# Patient Record
Sex: Female | Born: 1960 | Hispanic: Yes | Marital: Married | Smoking: Never smoker
Health system: Southern US, Community
[De-identification: ages and names within clinical notes are randomized; demographics above are authoritative.]

## PROBLEM LIST (undated history)

## (undated) DIAGNOSIS — E119 Type 2 diabetes mellitus without complications: Secondary | ICD-10-CM

## (undated) HISTORY — PX: EYE SURGERY: SHX253

## (undated) HISTORY — DX: Type 2 diabetes mellitus without complications: E11.9

---

## 2014-11-25 ENCOUNTER — Ambulatory Visit (INDEPENDENT_AMBULATORY_CARE_PROVIDER_SITE_OTHER): Payer: Self-pay | Admitting: Family Medicine

## 2014-11-25 VITALS — BP 120/74 | HR 73 | Temp 98.6°F | Resp 18 | Ht 60.0 in | Wt 127.0 lb

## 2014-11-25 DIAGNOSIS — R51 Headache: Secondary | ICD-10-CM

## 2014-11-25 DIAGNOSIS — J029 Acute pharyngitis, unspecified: Secondary | ICD-10-CM

## 2014-11-25 DIAGNOSIS — Z789 Other specified health status: Secondary | ICD-10-CM

## 2014-11-25 DIAGNOSIS — E1165 Type 2 diabetes mellitus with hyperglycemia: Secondary | ICD-10-CM

## 2014-11-25 DIAGNOSIS — D72829 Elevated white blood cell count, unspecified: Secondary | ICD-10-CM

## 2014-11-25 DIAGNOSIS — IMO0002 Reserved for concepts with insufficient information to code with codable children: Secondary | ICD-10-CM

## 2014-11-25 DIAGNOSIS — R591 Generalized enlarged lymph nodes: Secondary | ICD-10-CM

## 2014-11-25 DIAGNOSIS — R519 Headache, unspecified: Secondary | ICD-10-CM

## 2014-11-25 DIAGNOSIS — R599 Enlarged lymph nodes, unspecified: Secondary | ICD-10-CM

## 2014-11-25 LAB — POCT CBC
Granulocyte percent: 78.9 %G (ref 37–80)
HCT, POC: 41.4 % (ref 37.7–47.9)
Hemoglobin: 13.7 g/dL (ref 12.2–16.2)
Lymph, poc: 2.6 (ref 0.6–3.4)
MCH, POC: 27.8 pg (ref 27–31.2)
MCHC: 33.1 g/dL (ref 31.8–35.4)
MCV: 83.9 fL (ref 80–97)
MID (cbc): 0.8 (ref 0–0.9)
MPV: 8.7 fL (ref 0–99.8)
POC Granulocyte: 12.7 — AB (ref 2–6.9)
POC LYMPH PERCENT: 15.9 %L (ref 10–50)
POC MID %: 5.2 %M (ref 0–12)
Platelet Count, POC: 237 10*3/uL (ref 142–424)
RBC: 4.93 M/uL (ref 4.04–5.48)
RDW, POC: 13.3 %
WBC: 16.1 10*3/uL — AB (ref 4.6–10.2)

## 2014-11-25 LAB — POCT RAPID STREP A (OFFICE): Rapid Strep A Screen: NEGATIVE

## 2014-11-25 LAB — GLUCOSE, POCT (MANUAL RESULT ENTRY): POC Glucose: 213 mg/dl — AB (ref 70–99)

## 2014-11-25 MED ORDER — HYDROCODONE-ACETAMINOPHEN 7.5-325 MG/15ML PO SOLN: 10.0000 mL | Freq: Four times a day (QID) | ORAL | Status: DC | PRN

## 2014-11-25 MED ORDER — AMOXICILLIN-POT CLAVULANATE 400-57 MG/5ML PO SUSR: ORAL | Status: DC

## 2014-11-25 MED ORDER — CEFTRIAXONE SODIUM 1 G IJ SOLR
1.0000 g | Freq: Once | INTRAMUSCULAR | Status: AC
  Administered 2014-11-25: 1 g via INTRAMUSCULAR

## 2014-11-25 NOTE — Progress Notes (Signed)
Subjective:    Patient ID: Brooke Dorsey, female    DOB: May 03, 1960, 54 y.o.   MRN: [id number]  HPI Brooke Dorsey is a 54 y.o. female  New patient to our office, here with acute complaints of headache and sore throat. She has a history of diabetes managed with metformin 850mg  BID and 75/25 humalog insulin at 8-10 units in am only. moved from [place] 3 months ago. Home blood sugar 200, 180 past few days. Doctor is in [place] - planning on returning there in 2 months.   Complains of sore throat and swelling on left side of neck past 3 days. Able to drink fluids, but trouble eating d/t pain. HA and face pain on left. No face weakness or numbness - only pain. No weakness, no n/v. No cat scratches or cats at home.   Sore to swallow, but she is clearing her secretions. Denies any dental pain or jaw pain.  Tx: none.   There are no active problems to display for this patient.  Past Medical History  Diagnosis Date  . Diabetes mellitus without complication    History reviewed. No pertinent past surgical history. No Known Allergies Prior to Admission medications   Medication Sig Start Date End Date Taking? Authorizing Provider  insulin lispro protamine-lispro (HUMALOG 75/25 MIX) (75-25) 100 UNIT/ML SUSP injection Inject into the skin.   Yes Historical Provider, MD  METFORMIN HCL PO Take by mouth.   Yes Historical Provider, MD   History   Social History  . Marital Status: Married    Spouse Name: N/A  . Number of Children: N/A  . Years of Education: N/A   Occupational History  . Not on file.   Social History Main Topics  . Smoking status: Never Smoker   . Smokeless tobacco: Not on file  . Alcohol Use: No  . Drug Use: No  . Sexual Activity: Not on file   Other Topics Concern  . Not on file   Social History Narrative  . No narrative on file       Review of Systems  Constitutional: Negative for fever and chills.  HENT: Positive for ear pain (l side only. ), sinus  pressure (face pain more than pressure. ), sore throat and trouble swallowing. Negative for congestion, dental problem, ear discharge, facial swelling, hearing loss, mouth sores and voice change.   Respiratory: Negative for cough, chest tightness and shortness of breath.   Cardiovascular: Negative for chest pain.  Musculoskeletal: Positive for neck pain (in front only. no posterior neck pain or stiffness. ).  Skin: Negative for color change, rash and wound.       Objective:   Physical Exam  Constitutional: She is oriented to person, place, and time. She appears well-developed and well-nourished. No distress.  HENT:  Head: Normocephalic and atraumatic.  Right Ear: Hearing, tympanic membrane, external ear and ear canal normal.  Left Ear: Hearing, tympanic membrane, external ear and ear canal normal.  Nose: Nose normal. Right sinus exhibits no maxillary sinus tenderness and no frontal sinus tenderness. Left sinus exhibits no maxillary sinus tenderness and no frontal sinus tenderness.  Mouth/Throat: Oropharynx is clear and moist and mucous membranes are normal. No oropharyngeal exudate, posterior oropharyngeal edema or posterior oropharyngeal erythema.  Diffusely tender over the left side of her face to frontal scalp, but primarily below the left ear and swollen lymph node left neck. No rash. No mastoid tenderness, no jaw tenderness, and no pain along the gumline.Dentures removed  -  no gum lesions. No dental tenderness. No rash. Does have some pain with opening her mouth fully, but reports this is in the back of her throat and the swollen lymph node in her neck that is causing the pain  In her throat I do not see any patches of erythema or white adherence. No apparent tonsillar hypertrophy or exudate. .  Eyes: Conjunctivae and EOM are normal. Pupils are equal, round, and reactive to light.  Neck: No thyromegaly present.  Cardiovascular: Normal rate, regular rhythm, normal heart sounds and intact  distal pulses.   No murmur heard. Pulmonary/Chest: Effort normal and breath sounds normal. No respiratory distress. She has no wheezes. She has no rhonchi.  Lymphadenopathy:    She has cervical adenopathy (Left AC node enlarged,  tender to palpation,).  Neurological: She is alert and oriented to person, place, and time.  Skin: Skin is warm and dry. No rash noted.  Psychiatric: She has a normal mood and affect. Her behavior is normal.  Vitals reviewed.  Filed Vitals:   11/25/14 1236  BP: 120/74  Pulse: 73  Temp: 98.6 F (37 C)  TempSrc: Oral  Resp: 18  Height: 5' (1.524 m)  Weight: 127 lb (57.607 kg)  SpO2: 98%    Results for orders placed or performed in visit on 11/25/14  POCT CBC  Result Value Ref Range   WBC 16.1 (A) 4.6 - 10.2 K/uL   Lymph, poc 2.6 0.6 - 3.4   POC LYMPH PERCENT 15.9 10 - 50 %L   MID (cbc) 0.8 0 - 0.9   POC MID % 5.2 0 - 12 %M   POC Granulocyte 12.7 (A) 2 - 6.9   Granulocyte percent 78.9 37 - 80 %G   RBC 4.93 4.04 - 5.48 M/uL   Hemoglobin 13.7 12.2 - 16.2 g/dL   HCT, POC 16.1 09.6 - 47.9 %   MCV 83.9 80 - 97 fL   MCH, POC 27.8 27 - 31.2 pg   MCHC 33.1 31.8 - 35.4 g/dL   RDW, POC 04.5 %   Platelet Count, POC 237 142 - 424 K/uL   MPV 8.7 0 - 99.8 fL  POCT glucose (manual entry)  Result Value Ref Range   POC Glucose 213 (A) 70 - 99 mg/dl  POCT rapid strep A  Result Value Ref Range   Rapid Strep A Screen Negative Negative       Assessment & Plan:   Brooke Dorsey is a 54 y.o. female Sore throat - Plan: POCT CBC, POCT rapid strep A, Culture, Group A Strep, HYDROcodone-acetaminophen (HYCET) 7.5-325 mg/15 ml solution  Lymphadenopathy of head and neck - Plan: POCT CBC, POCT rapid strep A, cefTRIAXone (ROCEPHIN) injection 1 g, amoxicillin-clavulanate (AUGMENTIN) 400-57 MG/5ML suspension  Nonintractable headache, unspecified chronicity pattern, unspecified headache type - Plan: POCT glucose (manual entry)  Diabetes type 2, uncontrolled - Plan:  POCT glucose (manual entry)  Language barrier  Leukocytosis - Plan: cefTRIAXone (ROCEPHIN) injection 1 g, amoxicillin-clavulanate (AUGMENTIN) 400-57 MG/5ML suspension  Left-sided neck lymphadenopathy, with headache for past 2-3 days. Also complains of pain with swallowing, but negative rapid strep. Leukocytosis without fever. No apparent oral source for infection, and jaw nontender. Slight discomfort with complete opening of mouth, concerning for possible pharyngitis with false-negative rapid strep versus early retropharyngeal abscess.   -Options discussed, but as afebrile and early symptoms, we decided on Rocephin 1 g IM 1, start Augmentin syrup, and hydrocodone suspension if needed for pain.  - Will follow-up tomorrow  morning with Dr. Katrinka Blazing for recheck, and if any worsening may need CT of neck to rule out significant abscess.  -Overnight ER/return to clinic precautions discussed.  -For diabetes, advised to check home blood sugars, and if remaining over 200, let us know or call primary provider in [place] to discuss medication changes.  -Spanish spoken with understanding expressed. All questions answered.     Meds ordered this encounter  Medications  . insulin lispro protamine-lispro (HUMALOG 75/25 MIX) (75-25) 100 UNIT/ML SUSP injection    Sig: Inject into the skin.  Marland Kitchen METFORMIN HCL PO    Sig: Take 850 mg by mouth 2 (two) times daily.  . cefTRIAXone (ROCEPHIN) injection 1 g    Sig:     Order Specific Question:  Antibiotic Indication:    Answer:  Bacteremia  . amoxicillin-clavulanate (AUGMENTIN) 400-57 MG/5ML suspension    Sig: 9ml po BID for 10 days.   Label in spanish    Dispense:  200 mL    Refill:  0  . HYDROcodone-acetaminophen (HYCET) 7.5-325 mg/15 ml solution    Sig: Take 10-15 mLs by mouth every 6 (six) hours as needed for moderate pain. Label in spanish    Dispense:  120 mL    Refill:  0   Patient Instructions  pastilla de antibiotica 2 veces cada dia, hydrocodone  liquido si necesario por dolor, regrese a la clinica por visita de Dr. Katrinka Blazing en la [place].  Si empeorse esta noche - va a hospital/cuarto de emergencia.   Si su Airline pilot mas de 200 - es necesario cambio su medicina de diabetes. Regresa hablar diabetes si remanecer alta.   Linfadenopata (Lymphadenopathy) Linfadenopata significa "enfermedad de los ganglios linfticos". Pero el trmino se [place] para describir la hinchazn o dilatacin de las glndulas linfticas, tambin llamadas ganglios linfticos. Son rganos con forma de frijol que se encuentran en muchos lugares, entre ellos el cuello, las axilas y la ingle. Las glndulas linfticas son parte del sistema inmunolgico, que lucha contra las infecciones en su cuerpo. La linfadenopata puede ocurrir slo en una zona de su cuerpo, como el cuello, o puede estar generalizada y Hydrographic surveyor un agrandamiento de los ganglios en varios lugares. Los ganglios del cuello son los que ms comnmente presentan el trastorno. CAUSAS  Cuando su sistema inmunolgico responde a los grmenes (como virus o bacterias), se producen clulas y lquidos para luchar contra la infeccin. Esto hace que las glndulas aumenten su tamao. Esto por lo general es normal y no debe preocuparse. En ocasiones, las mismas glndulas pueden infectarse e hincharse. Esto se denomina linfadenitis. El agrandamiento de los ganglios linfticos puede estar causado por [employer].  Enfermedades bacterianas, como faringitis estreptoccica o una infeccin de la piel.  Enfermedades virales, como el resfro comn.  Otros grmenes, como la enfermedad de Lyme, tuberculosis, o enfermedades de transmisin sexual.  Ciertos tipos de cncer, como linfoma (cncer del sistema linftico) o leucemia (cncer de los glbulos blancos).  Enfermedades inflamatorias como el lupus o la artritis reumatoide.  Reacciones alrgicas a medicamentos. Muchas de las enfermedades mencionadas arriba son poco  frecuentes, Scientist, clinical (histocompatibility and immunogenetics). Es por esto que debe ver al profesional que lo asiste si tiene linfadenopata. SNTOMAS  Bultos inflamados en el cuello, la parte posterior de la cabeza u otros lugares.  Sensibilidad.  Calor o enrojecimiento de la piel sobre los ganglios linfticos.  Grant Ruts. DIAGNSTICO A menudo el agrandamiento de los ganglios linfticos ocurre cerca del origen de la infeccin. Esto puede ayudar a los mdicos  a diagnosticar su enfermedad. Para ejemplo:   La presencia de ganglios linfticos inflamados cerca de la mandbula puede ser resultado de una infeccin en la boca.  Ganglios inflamados en el cuello son a menudo una seal de una infeccin en la garganta.  Si existen ganglios inflamados en ms de una zona es probable que exista una enfermedad causada por un virus. El profesional muy probablemente sabr qu es lo que causa su linfadenopata luego de Solicitor su historial y de examinarlo. Pueden ser necesarios anlisis de [place], radiografas y [place]. Si no se puede encontrar la causa de la inflamacin de los [place], y sta no se va por s misma, entonces puede ser necesaria una biopsia. El profesional lo comentar con usted. TRATAMIENTO El tratamiento depender de la causa que provoca la inflamacin. Muchas veces los ganglios volvern a su tamao normal, sin necesidad de Pharmacist, community. Es posible que deba utilizar antibiticos u otros medicamentos para tratar una infeccin. Slo tome medicamentos de venta libre o los que le prescriba su mdico para Engineer, materials, el malestar o la fiebre, segn las indicaciones. INSTRUCCIONES PARA EL CUIDADO DOMICILIARIO Las glndulas linfticas inflamadas volvern a su tamao normal cuando el trastorno subyacente que causa la inflamacin se haya curado. Si la inflamacin persiste, consulte con el profesional que lo asiste. Es posible que le prescriba antibiticos u otro tratamiento, esto depende del diagnstico. Utilice los  medicamentos tal como se le indic. Vaya a las citas de seguimiento que se hayan programado para controlar el estado de sus ganglios.  SOLICITE ATENCIN MDICA SI:  La inflamacin dura ms de [employer].  Tiene sntomas como prdida de [place], transpira por la noche, fatiga o fiebre persistente.  Los ganglios se sienten duros, parecen fijos en la piel o crecen con rapidez.  La piel sobre los ganglios linfticos se ve roja e inflamada. Esto puede ser indicio de una infeccin. SOLICITE ATENCIN MDICA DE INMEDIATO SI:  Comienza a filtrarse lquido de la zona del ganglio inflamado.  La temperatura se eleva por encima de 102 F (38.9 C).  Siente dolor fuerte (no necesariamente en la zona del ganglio inflamado).  Siente falta de aire o Journalist, newspaper.  Presenta dolor abdominal que empeora. ASEGRESE DE QUE:   Comprende estas instrucciones.  Controlar el trastorno.  Pedir ayuda enseguida si no se recupera adecuadamente o si empeora. Document Released: 07/11/2008 Document Revised: 07/07/2011 [facility] Patient Information 2015 [place], [place]. This information is not intended to replace advice given to you by your health care provider. Make sure you discuss any questions you have with your health care provider.

## 2014-11-25 NOTE — Patient Instructions (Addendum)
pastilla de antibiotica 2 veces cada dia, hydrocodone liquido si necesario por dolor, regrese a la clinica por visita de Dr. Katrinka Blazing en la [place].  Si empeorse esta noche - va a hospital/cuarto de emergencia.   Si su Airline pilot mas de 200 - es necesario cambio su medicina de diabetes. Regresa hablar diabetes si remanecer alta.   Linfadenopata (Lymphadenopathy) Linfadenopata significa "enfermedad de los ganglios linfticos". Pero el trmino se [place] para describir la hinchazn o dilatacin de las glndulas linfticas, tambin llamadas ganglios linfticos. Son rganos con forma de frijol que se encuentran en muchos lugares, entre ellos el cuello, las axilas y la ingle. Las glndulas linfticas son parte del sistema inmunolgico, que lucha contra las infecciones en su cuerpo. La linfadenopata puede ocurrir slo en una zona de su cuerpo, como el cuello, o puede estar generalizada y Hydrographic surveyor un agrandamiento de los ganglios en varios lugares. Los ganglios del cuello son los que ms comnmente presentan el trastorno. CAUSAS  Cuando su sistema inmunolgico responde a los grmenes (como virus o bacterias), se producen clulas y lquidos para luchar contra la infeccin. Esto hace que las glndulas aumenten su tamao. Esto por lo general es normal y no debe preocuparse. En ocasiones, las mismas glndulas pueden infectarse e hincharse. Esto se denomina linfadenitis. El agrandamiento de los ganglios linfticos puede estar causado por [employer].  Enfermedades bacterianas, como faringitis estreptoccica o una infeccin de la piel.  Enfermedades virales, como el resfro comn.  Otros grmenes, como la enfermedad de Lyme, tuberculosis, o enfermedades de transmisin sexual.  Ciertos tipos de cncer, como linfoma (cncer del sistema linftico) o leucemia (cncer de los glbulos blancos).  Enfermedades inflamatorias como el lupus o la artritis reumatoide.  Reacciones alrgicas a  medicamentos. Muchas de las enfermedades mencionadas arriba son poco frecuentes, Scientist, clinical (histocompatibility and immunogenetics). Es por esto que debe ver al profesional que lo asiste si tiene linfadenopata. SNTOMAS  Bultos inflamados en el cuello, la parte posterior de la cabeza u otros lugares.  Sensibilidad.  Calor o enrojecimiento de la piel sobre los ganglios linfticos.  Grant Ruts. DIAGNSTICO A menudo el agrandamiento de los ganglios linfticos ocurre cerca del origen de la infeccin. Esto puede ayudar a los mdicos a Publishing copy. Para ejemplo:   La presencia de ganglios linfticos inflamados cerca de la mandbula puede ser resultado de una infeccin en la boca.  Ganglios inflamados en el cuello son a menudo una seal de una infeccin en la garganta.  Si existen ganglios inflamados en ms de una zona es probable que exista una enfermedad causada por un virus. El profesional muy probablemente sabr qu es lo que causa su linfadenopata luego de Solicitor su historial y de examinarlo. Pueden ser necesarios anlisis de [place], radiografas y [place]. Si no se puede encontrar la causa de la inflamacin de los [place], y sta no se va por s misma, entonces puede ser necesaria una biopsia. El profesional lo comentar con usted. TRATAMIENTO El tratamiento depender de la causa que provoca la inflamacin. Muchas veces los ganglios volvern a su tamao normal, sin necesidad de Pharmacist, community. Es posible que deba utilizar antibiticos u otros medicamentos para tratar una infeccin. Slo tome medicamentos de venta libre o los que le prescriba su mdico para Engineer, materials, el malestar o la fiebre, segn las indicaciones. INSTRUCCIONES PARA EL CUIDADO DOMICILIARIO Las glndulas linfticas inflamadas volvern a su tamao normal cuando el trastorno subyacente que causa la inflamacin se haya curado. Si la inflamacin persiste, consulte con el  profesional que lo asiste. Es posible que le prescriba antibiticos  u otro tratamiento, esto depende del diagnstico. Utilice los medicamentos tal como se le indic. Vaya a las citas de seguimiento que se hayan programado para controlar el estado de sus ganglios.  SOLICITE ATENCIN MDICA SI:  La inflamacin dura ms de [employer].  Tiene sntomas como prdida de [place], transpira por la noche, fatiga o fiebre persistente.  Los ganglios se sienten duros, parecen fijos en la piel o crecen con rapidez.  La piel sobre los ganglios linfticos se ve roja e inflamada. Esto puede ser indicio de una infeccin. SOLICITE ATENCIN MDICA DE INMEDIATO SI:  Comienza a filtrarse lquido de la zona del ganglio inflamado.  La temperatura se eleva por encima de 102 F (38.9 C).  Siente dolor fuerte (no necesariamente en la zona del ganglio inflamado).  Siente falta de aire o Journalist, newspaper.  Presenta dolor abdominal que empeora. ASEGRESE DE QUE:   Comprende estas instrucciones.  Controlar el trastorno.  Pedir ayuda enseguida si no se recupera adecuadamente o si empeora. Document Released: 07/11/2008 Document Revised: 07/07/2011 [facility] Patient Information 2015 [place], [place]. This information is not intended to replace advice given to you by your health care provider. Make sure you discuss any questions you have with your health care provider.

## 2014-11-26 ENCOUNTER — Encounter (HOSPITAL_COMMUNITY): Payer: Self-pay | Admitting: *Deleted

## 2014-11-26 ENCOUNTER — Emergency Department (HOSPITAL_COMMUNITY)
Admission: EM | Admit: 2014-11-26 | Discharge: 2014-11-26 | Disposition: A | Discharge status: Home / Self Care | Payer: Self-pay | Attending: Emergency Medicine | Admitting: Emergency Medicine

## 2014-11-26 ENCOUNTER — Emergency Department (HOSPITAL_COMMUNITY): Payer: Self-pay

## 2014-11-26 DIAGNOSIS — Z79899 Other long term (current) drug therapy: Secondary | ICD-10-CM | POA: Insufficient documentation

## 2014-11-26 DIAGNOSIS — R739 Hyperglycemia, unspecified: Secondary | ICD-10-CM

## 2014-11-26 DIAGNOSIS — J029 Acute pharyngitis, unspecified: Secondary | ICD-10-CM | POA: Insufficient documentation

## 2014-11-26 DIAGNOSIS — E1165 Type 2 diabetes mellitus with hyperglycemia: Secondary | ICD-10-CM | POA: Insufficient documentation

## 2014-11-26 DIAGNOSIS — R112 Nausea with vomiting, unspecified: Secondary | ICD-10-CM | POA: Insufficient documentation

## 2014-11-26 LAB — I-STAT CHEM 8, ED
BUN: 7 mg/dL (ref 6–20)
Calcium, Ion: 1.16 mmol/L (ref 1.12–1.23)
Chloride: 97 mmol/L — ABNORMAL LOW (ref 101–111)
Creatinine, Ser: 0.6 mg/dL (ref 0.44–1.00)
Glucose, Bld: 321 mg/dL — ABNORMAL HIGH (ref 65–99)
HCT: 42 % (ref 36.0–46.0)
Hemoglobin: 14.3 g/dL (ref 12.0–15.0)
Potassium: 3.9 mmol/L (ref 3.5–5.1)
Sodium: 133 mmol/L — ABNORMAL LOW (ref 135–145)
TCO2: 22 mmol/L (ref 0–100)

## 2014-11-26 LAB — CULTURE, GROUP A STREP: Organism ID, Bacteria: NORMAL

## 2014-11-26 LAB — COMPREHENSIVE METABOLIC PANEL
ALT: 17 U/L (ref 14–54)
AST: 17 U/L (ref 15–41)
Albumin: 3.4 g/dL — ABNORMAL LOW (ref 3.5–5.0)
Alkaline Phosphatase: 102 U/L (ref 38–126)
Anion gap: 14 (ref 5–15)
BUN: 7 mg/dL (ref 6–20)
CO2: 24 mmol/L (ref 22–32)
Calcium: 9.1 mg/dL (ref 8.9–10.3)
Chloride: 95 mmol/L — ABNORMAL LOW (ref 101–111)
Creatinine, Ser: 0.73 mg/dL (ref 0.44–1.00)
GFR calc Af Amer: >60 mL/min (ref >60)
GFR calc non Af Amer: >60 mL/min (ref >60)
Glucose, Bld: 318 mg/dL — ABNORMAL HIGH (ref 65–99)
Potassium: 3.8 mmol/L (ref 3.5–5.1)
Sodium: 133 mmol/L — ABNORMAL LOW (ref 135–145)
Total Bilirubin: 1.4 mg/dL — ABNORMAL HIGH (ref 0.3–1.2)
Total Protein: 6.9 g/dL (ref 6.5–8.1)

## 2014-11-26 LAB — CBC WITH DIFFERENTIAL/PLATELET
Basophils Absolute: 0 10*3/uL (ref 0.0–0.1)
Basophils Relative: 0 % (ref 0–1)
Eosinophils Absolute: 0 10*3/uL (ref 0.0–0.7)
Eosinophils Relative: 0 % (ref 0–5)
HCT: 39.2 % (ref 36.0–46.0)
Hemoglobin: 13.4 g/dL (ref 12.0–15.0)
LYMPHS ABS: 1.8 10*3/uL (ref 0.7–4.0)
LYMPHS PCT: 11 % — AB (ref 12–46)
MCH: 28.5 pg (ref 26.0–34.0)
MCHC: 34.2 g/dL (ref 30.0–36.0)
MCV: 83.4 fL (ref 78.0–100.0)
MONO ABS: 1.1 10*3/uL — AB (ref 0.1–1.0)
MONOS PCT: 6 % (ref 3–12)
Neutro Abs: 13.5 10*3/uL — ABNORMAL HIGH (ref 1.7–7.7)
Neutrophils Relative %: 83 % — ABNORMAL HIGH (ref 43–77)
Platelets: 223 10*3/uL (ref 150–400)
RBC: 4.7 MIL/uL (ref 3.87–5.11)
RDW: 12.6 % (ref 11.5–15.5)
WBC: 16.4 10*3/uL — AB (ref 4.0–10.5)

## 2014-11-26 LAB — CBG MONITORING, ED
GLUCOSE-CAPILLARY: 269 mg/dL — AB (ref 65–99)
Glucose-Capillary: 237 mg/dL — ABNORMAL HIGH (ref 65–99)

## 2014-11-26 LAB — LIPASE, BLOOD: LIPASE: 11 U/L — AB (ref 22–51)

## 2014-11-26 MED ORDER — AMOXICILLIN-POT CLAVULANATE 875-125 MG PO TABS
1.0000 | ORAL_TABLET | Freq: Once | ORAL | Status: AC
  Administered 2014-11-26: 1 via ORAL
  Filled 2014-11-26: qty 1

## 2014-11-26 MED ORDER — ONDANSETRON HCL 4 MG/2ML IJ SOLN: 4.0000 mg | Freq: Once | INTRAMUSCULAR | Status: DC

## 2014-11-26 MED ORDER — MORPHINE SULFATE 4 MG/ML IJ SOLN
4.0000 mg | Freq: Once | INTRAMUSCULAR | Status: AC
  Administered 2014-11-26: 4 mg via INTRAVENOUS
  Filled 2014-11-26: qty 1

## 2014-11-26 MED ORDER — METFORMIN HCL 850 MG PO TABS
850.0000 mg | ORAL_TABLET | Freq: Two times a day (BID) | ORAL | Status: DC
  Administered 2014-11-26: 850 mg via ORAL
  Filled 2014-11-26 (×2): qty 1

## 2014-11-26 MED ORDER — IBUPROFEN 800 MG PO TABS
800.0000 mg | ORAL_TABLET | Freq: Once | ORAL | Status: AC
  Administered 2014-11-26: 800 mg via ORAL
  Filled 2014-11-26: qty 1

## 2014-11-26 MED ORDER — ONDANSETRON 4 MG PO TBDP: ORAL_TABLET | ORAL | Status: DC

## 2014-11-26 MED ORDER — SODIUM CHLORIDE 0.9 % IV SOLN
Freq: Once | INTRAVENOUS | Status: AC
  Administered 2014-11-26: 11:00:00 via INTRAVENOUS

## 2014-11-26 MED ORDER — ONDANSETRON HCL 4 MG/2ML IJ SOLN
4.0000 mg | Freq: Once | INTRAMUSCULAR | Status: AC
  Administered 2014-11-26: 4 mg via INTRAVENOUS
  Filled 2014-11-26: qty 2

## 2014-11-26 MED ORDER — IOHEXOL 300 MG/ML  SOLN
75.0000 mL | Freq: Once | INTRAMUSCULAR | Status: AC | PRN
  Administered 2014-11-26: 75 mL via INTRAVENOUS

## 2014-11-26 NOTE — ED Notes (Signed)
Patient transported to CT 

## 2014-11-26 NOTE — ED Notes (Signed)
Monitoring patient after receiving Morphine for 15 minutes.  Charge RN made aware.

## 2014-11-26 NOTE — Discharge Instructions (Signed)
You have a developing infection in your tonsils. It is very important to take the antibiotic your were prescribed. If you are unable to swallow, start drooling or have trouble breathing or talking return to the ER immediately. Follow up with the ENT specialist if your symptoms are not better by tomorrow.

## 2014-11-26 NOTE — ED Notes (Signed)
Pt started taking antibiotics for a sore throat yesterday.  She has been vomiting the meds up and not eating.  CBG in 200's, pt states did not take insulin today.  Per family, pt was given a shot yesterday.

## 2014-11-26 NOTE — ED Provider Notes (Signed)
CSN: [id number]     Arrival date & time 11/26/14  4098 History   None    Chief Complaint  Patient presents with  . Sore Throat  . Hyperglycemia     (Consider location/radiation/quality/duration/timing/severity/associated sxs/prior Treatment) HPI Comments: Brooke Dorsey, 54 y/o female with DM presents with a sore throat and hyperglycemia. History was limited by a language barrier, but her son was in the room and translated and provided the history. The sore throat began yesterday and she went to her PCP who gave her a 1 g Rocephin injection and prescribed her an augmentin suspension and hycet solution. Her pain is on the left side. She complains of swelling and pain with swallowing. She has had nothing to eat or drink since yesterday. This morning she vomited after taking her liquid medications. She is not currently nauseas. Patient also has a current blood glucose level of 321 and did not take her insulin this morning. Her normal is in the 120s. She has not taken her temperature at home and does not think she has had fevers. She denies night sweats or chills.   The history is provided by a relative and the patient. The history is limited by a language barrier.    Past Medical History  Diagnosis Date  . Diabetes mellitus without complication    Past Surgical History  Procedure Laterality Date  . Eye surgery      L eye removed   No family history on file. History  Substance Use Topics  . Smoking status: Never Smoker   . Smokeless tobacco: Not on file  . Alcohol Use: No   OB History    No data available     Review of Systems  HENT: Positive for sore throat and trouble swallowing.   Gastrointestinal: Positive for nausea and vomiting.  All other systems reviewed and are negative.     Allergies  Review of patient's allergies indicates no known allergies.  Home Medications   Prior to Admission medications   Medication Sig Start Date End Date Taking? Authorizing Provider   amoxicillin-clavulanate (AUGMENTIN) 400-57 MG/5ML suspension 9ml po BID for 10 days.   Label in spanish 11/25/14   Shade Flood, MD  HYDROcodone-acetaminophen (HYCET) 7.5-325 mg/15 ml solution Take 10-15 mLs by mouth every 6 (six) hours as needed for moderate pain. Label in spanish 11/25/14 11/25/15  Shade Flood, MD  insulin lispro protamine-lispro (HUMALOG 75/25 MIX) (75-25) 100 UNIT/ML SUSP injection Inject into the skin.    Historical Provider, MD  METFORMIN HCL PO Take 850 mg by mouth 2 (two) times daily.    Historical Provider, MD   BP 137/74 mmHg  Pulse 89  Temp(Src) 98.9 F (37.2 C) (Oral)  Resp 17  Ht 5\' 1"  (1.549 m)  Wt 127 lb (57.607 kg)  BMI 24.01 kg/m2  SpO2 99% Physical Exam  Constitutional: She is oriented to person, place, and time. She appears well-developed and well-nourished. No distress.  HENT:  Head: Normocephalic and atraumatic.  Mouth/Throat: Uvula is midline, oropharynx is clear and moist and mucous membranes are normal. No trismus in the jaw. No uvula swelling. No oropharyngeal exudate, posterior oropharyngeal edema, posterior oropharyngeal erythema or tonsillar abscesses.  Eyes: Conjunctivae are normal. No scleral icterus.  Neck: Normal range of motion.  Cardiovascular: Normal rate, regular rhythm, normal heart sounds and intact distal pulses.  Exam reveals no gallop and no friction rub.   No murmur heard. Pulmonary/Chest: Effort normal and breath sounds normal. No respiratory distress.  Abdominal: Soft. Bowel sounds are normal. She exhibits no distension and no mass. There is no tenderness. There is no guarding.  Lymphadenopathy:    She has cervical adenopathy.  Neurological: She is alert and oriented to person, place, and time.  Skin: Skin is warm and dry. She is not diaphoretic.    ED Course  Procedures (including critical care time) Labs Review Labs Reviewed  COMPREHENSIVE METABOLIC PANEL - Abnormal; Notable for the following:    Sodium 133  (*)    Chloride 95 (*)    Glucose, Bld 318 (*)    Albumin 3.4 (*)    Total Bilirubin 1.4 (*)    All other components within normal limits  LIPASE, BLOOD - Abnormal; Notable for the following:    Lipase 11 (*)    All other components within normal limits  CBC WITH DIFFERENTIAL/PLATELET - Abnormal; Notable for the following:    WBC 16.4 (*)    Neutrophils Relative % 83 (*)    Neutro Abs 13.5 (*)    Lymphocytes Relative 11 (*)    Monocytes Absolute 1.1 (*)    All other components within normal limits  CBG MONITORING, ED - Abnormal; Notable for the following:    Glucose-Capillary 269 (*)    All other components within normal limits  I-STAT CHEM 8, ED - Abnormal; Notable for the following:    Sodium 133 (*)    Chloride 97 (*)    Glucose, Bld 321 (*)    All other components within normal limits  CBG MONITORING, ED - Abnormal; Notable for the following:    Glucose-Capillary 237 (*)    All other components within normal limits    Imaging Review Ct Soft Tissue Neck W Contrast  11/26/2014   CLINICAL DATA:  Left neck swelling and pain and dysphagia for past 3 days.  EXAM: CT NECK WITH CONTRAST  TECHNIQUE: Multidetector CT imaging of the neck was performed using the standard protocol following the bolus administration of intravenous contrast.  CONTRAST:  75mL OMNIPAQUE IOHEXOL 300 MG/ML  SOLN  COMPARISON:  None.  FINDINGS: Asymmetric swelling and decreased attenuation is seen involving the left palatine tonsil, epiglottis, and retropharyngeal soft tissues. No focal fluid collection seen to suggest abscess formation.  Mild left level 2 jugular lymphadenopathy is seen with largest lymph node measuring up to 12 mm. No evidence of central necrosis. This is likely reactive in etiology. No other masses or lymphadenopathy identified within the neck.  IMPRESSION: Asymmetric edema involving the left palatine tonsil, epiglottis, and retropharyngeal soft tissues. No discrete abscess identified.  Mild left  level 2 jugular lymphadenopathy, likely reactive in etiology.   Electronically Signed   By: Myles Rosenthal M.D.   On: 11/26/2014 13:03     EKG Interpretation None      MDM   Final diagnoses:  Pharyngitis  Hyperglycemia    Patient able to tolerate by mouth fluids and her antibiotics. Pain medications given. CT scan shows lymphadenopathy without signs of deep space infections of the neck. This may still represent a developing abscess. However, there is nothing discrete at this point. Patient will be discharged. Follow up with ear, nose and throat tomorrow. Continues to take antibiotics and pain medication. Discussed return precautions. Patient appears safe for discharge at this time    Arthor Captain, PA-C 11/27/14 1856  Pricilla Loveless, MD 11/28/14 [phone]

## 2014-11-27 ENCOUNTER — Encounter (HOSPITAL_COMMUNITY): Payer: Self-pay | Admitting: Emergency Medicine

## 2014-11-27 ENCOUNTER — Inpatient Hospital Stay (HOSPITAL_COMMUNITY)
Admission: EM | Admit: 2014-11-27 | Discharge: 2014-12-03 | DRG: 152 | Disposition: A | Discharge status: Home / Self Care | Payer: Self-pay | Attending: Internal Medicine | Admitting: Internal Medicine

## 2014-11-27 DIAGNOSIS — H5702 Anisocoria: Secondary | ICD-10-CM

## 2014-11-27 DIAGNOSIS — R739 Hyperglycemia, unspecified: Secondary | ICD-10-CM

## 2014-11-27 DIAGNOSIS — J029 Acute pharyngitis, unspecified: Secondary | ICD-10-CM

## 2014-11-27 DIAGNOSIS — D72829 Elevated white blood cell count, unspecified: Secondary | ICD-10-CM

## 2014-11-27 DIAGNOSIS — L089 Local infection of the skin and subcutaneous tissue, unspecified: Secondary | ICD-10-CM

## 2014-11-27 DIAGNOSIS — R1310 Dysphagia, unspecified: Secondary | ICD-10-CM | POA: Diagnosis present

## 2014-11-27 DIAGNOSIS — D649 Anemia, unspecified: Secondary | ICD-10-CM | POA: Diagnosis present

## 2014-11-27 DIAGNOSIS — J392 Other diseases of pharynx: Secondary | ICD-10-CM

## 2014-11-27 DIAGNOSIS — J9600 Acute respiratory failure, unspecified whether with hypoxia or hypercapnia: Secondary | ICD-10-CM

## 2014-11-27 DIAGNOSIS — R591 Generalized enlarged lymph nodes: Secondary | ICD-10-CM

## 2014-11-27 DIAGNOSIS — J988 Other specified respiratory disorders: Secondary | ICD-10-CM | POA: Diagnosis present

## 2014-11-27 DIAGNOSIS — R221 Localized swelling, mass and lump, neck: Secondary | ICD-10-CM

## 2014-11-27 DIAGNOSIS — J390 Retropharyngeal and parapharyngeal abscess: Principal | ICD-10-CM | POA: Diagnosis present

## 2014-11-27 DIAGNOSIS — E1165 Type 2 diabetes mellitus with hyperglycemia: Secondary | ICD-10-CM | POA: Diagnosis present

## 2014-11-27 DIAGNOSIS — I9581 Postprocedural hypotension: Secondary | ICD-10-CM | POA: Diagnosis not present

## 2014-11-27 DIAGNOSIS — Z23 Encounter for immunization: Secondary | ICD-10-CM

## 2014-11-27 DIAGNOSIS — A419 Sepsis, unspecified organism: Secondary | ICD-10-CM | POA: Diagnosis present

## 2014-11-27 DIAGNOSIS — IMO0002 Reserved for concepts with insufficient information to code with codable children: Secondary | ICD-10-CM | POA: Insufficient documentation

## 2014-11-27 DIAGNOSIS — J9690 Respiratory failure, unspecified, unspecified whether with hypoxia or hypercapnia: Secondary | ICD-10-CM

## 2014-11-27 DIAGNOSIS — Z794 Long term (current) use of insulin: Secondary | ICD-10-CM

## 2014-11-27 DIAGNOSIS — J36 Peritonsillar abscess: Secondary | ICD-10-CM | POA: Diagnosis present

## 2014-11-27 DIAGNOSIS — E876 Hypokalemia: Secondary | ICD-10-CM | POA: Diagnosis present

## 2014-11-27 LAB — COMPREHENSIVE METABOLIC PANEL
ALBUMIN: 3.5 g/dL (ref 3.5–5.0)
ALT: 14 U/L (ref 14–54)
AST: 15 U/L (ref 15–41)
Alkaline Phosphatase: 102 U/L (ref 38–126)
Anion gap: 15 (ref 5–15)
BUN: 15 mg/dL (ref 6–20)
CALCIUM: 9.2 mg/dL (ref 8.9–10.3)
CO2: 16 mmol/L — ABNORMAL LOW (ref 22–32)
Chloride: 101 mmol/L (ref 101–111)
Creatinine, Ser: 0.97 mg/dL (ref 0.44–1.00)
GFR calc Af Amer: >60 mL/min (ref >60)
GFR calc non Af Amer: >60 mL/min (ref >60)
Glucose, Bld: 299 mg/dL — ABNORMAL HIGH (ref 65–99)
Potassium: 3.6 mmol/L (ref 3.5–5.1)
SODIUM: 132 mmol/L — AB (ref 135–145)
TOTAL PROTEIN: 7.6 g/dL (ref 6.5–8.1)
Total Bilirubin: 1.2 mg/dL (ref 0.3–1.2)

## 2014-11-27 LAB — CBC WITH DIFFERENTIAL/PLATELET
BASOS PCT: 0 % (ref 0–1)
Basophils Absolute: 0 10*3/uL (ref 0.0–0.1)
Eosinophils Absolute: 0 10*3/uL (ref 0.0–0.7)
Eosinophils Relative: 0 % (ref 0–5)
HCT: 37.7 % (ref 36.0–46.0)
Hemoglobin: 12.8 g/dL (ref 12.0–15.0)
Lymphocytes Relative: 9 % — ABNORMAL LOW (ref 12–46)
Lymphs Abs: 1.7 10*3/uL (ref 0.7–4.0)
MCH: 28.8 pg (ref 26.0–34.0)
MCHC: 34 g/dL (ref 30.0–36.0)
MCV: 84.9 fL (ref 78.0–100.0)
MONO ABS: 1.2 10*3/uL — AB (ref 0.1–1.0)
MONOS PCT: 6 % (ref 3–12)
Neutro Abs: 16 10*3/uL — ABNORMAL HIGH (ref 1.7–7.7)
Neutrophils Relative %: 85 % — ABNORMAL HIGH (ref 43–77)
Platelets: 267 10*3/uL (ref 150–400)
RBC: 4.44 MIL/uL (ref 3.87–5.11)
RDW: 12.9 % (ref 11.5–15.5)
WBC: 18.9 10*3/uL — ABNORMAL HIGH (ref 4.0–10.5)

## 2014-11-27 LAB — CBG MONITORING, ED: GLUCOSE-CAPILLARY: 274 mg/dL — AB (ref 65–99)

## 2014-11-27 LAB — LACTIC ACID, PLASMA: Lactic Acid, Venous: 1.1 mmol/L (ref 0.5–2.0)

## 2014-11-27 LAB — GLUCOSE, CAPILLARY: Glucose-Capillary: 198 mg/dL — ABNORMAL HIGH (ref 65–99)

## 2014-11-27 MED ORDER — INSULIN ASPART 100 UNIT/ML ~~LOC~~ SOLN: 0.0000 [IU] | Freq: Every day | SUBCUTANEOUS | Status: DC

## 2014-11-27 MED ORDER — PNEUMOCOCCAL VAC POLYVALENT 25 MCG/0.5ML IJ INJ
0.5000 mL | INJECTION | INTRAMUSCULAR | Status: DC
  Filled 2014-11-27: qty 0.5

## 2014-11-27 MED ORDER — ONDANSETRON HCL 4 MG/2ML IJ SOLN: 4.0000 mg | Freq: Four times a day (QID) | INTRAMUSCULAR | Status: DC | PRN

## 2014-11-27 MED ORDER — ONDANSETRON HCL 4 MG/2ML IJ SOLN
4.0000 mg | Freq: Once | INTRAMUSCULAR | Status: AC
  Administered 2014-11-27: 4 mg via INTRAVENOUS
  Filled 2014-11-27: qty 2

## 2014-11-27 MED ORDER — ENOXAPARIN SODIUM 40 MG/0.4ML ~~LOC~~ SOLN
40.0000 mg | SUBCUTANEOUS | Status: DC
  Administered 2014-11-28 – 2014-12-03 (×6): 40 mg via SUBCUTANEOUS
  Filled 2014-11-27 (×6): qty 0.4

## 2014-11-27 MED ORDER — ONDANSETRON HCL 4 MG PO TABS: 4.0000 mg | ORAL_TABLET | Freq: Four times a day (QID) | ORAL | Status: DC | PRN

## 2014-11-27 MED ORDER — SODIUM CHLORIDE 0.9 % IV BOLUS (SEPSIS): 1000.0000 mL | Freq: Once | INTRAVENOUS | Status: DC

## 2014-11-27 MED ORDER — INSULIN ASPART 100 UNIT/ML ~~LOC~~ SOLN
0.0000 [IU] | Freq: Three times a day (TID) | SUBCUTANEOUS | Status: DC
  Administered 2014-11-28 (×2): 2 [IU] via SUBCUTANEOUS

## 2014-11-27 MED ORDER — CLINDAMYCIN PHOSPHATE 600 MG/50ML IV SOLN
600.0000 mg | Freq: Three times a day (TID) | INTRAVENOUS | Status: DC
  Administered 2014-11-28 (×2): 600 mg via INTRAVENOUS
  Filled 2014-11-27 (×4): qty 50

## 2014-11-27 MED ORDER — CLINDAMYCIN PHOSPHATE 900 MG/50ML IV SOLN
900.0000 mg | Freq: Once | INTRAVENOUS | Status: AC
  Administered 2014-11-27: 900 mg via INTRAVENOUS
  Filled 2014-11-27: qty 50

## 2014-11-27 MED ORDER — SODIUM CHLORIDE 0.9 % IV SOLN
INTRAVENOUS | Status: AC
  Administered 2014-11-28: 06:00:00 via INTRAVENOUS

## 2014-11-27 MED ORDER — SODIUM CHLORIDE 0.9 % IV BOLUS (SEPSIS)
1000.0000 mL | Freq: Once | INTRAVENOUS | Status: AC
  Administered 2014-11-27: 1000 mL via INTRAVENOUS

## 2014-11-27 MED ORDER — MORPHINE SULFATE 2 MG/ML IJ SOLN
2.0000 mg | INTRAMUSCULAR | Status: DC | PRN
  Administered 2014-11-27 – 2014-11-28 (×4): 2 mg via INTRAVENOUS
  Filled 2014-11-27 (×4): qty 1

## 2014-11-27 MED ORDER — SODIUM CHLORIDE 0.9 % IV SOLN
INTRAVENOUS | Status: DC
  Administered 2014-11-27: 22:00:00 via INTRAVENOUS

## 2014-11-27 NOTE — ED Notes (Signed)
Patient states that she is taking antibiotics, but she is not able to keep them down. She is vomiting everytime she tries to take them.

## 2014-11-27 NOTE — ED Notes (Signed)
Pt. reports persistent left sided throat pain onset this week and elevated blood sugar ( 305) at home this evening with emesis , fatigue and poor appetite. Pt. seen by her PCP and here yesterday received antibiotics and currently taking prescription Clindamycin.

## 2014-11-27 NOTE — ED Provider Notes (Signed)
CSN: [id number]     Arrival date & time 11/27/14  1939 History   First MD Initiated Contact with Patient 11/27/14 2017     Chief Complaint  Patient presents with  . Sore Throat  . Hyperglycemia     (Consider location/radiation/quality/duration/timing/severity/associated sxs/prior Treatment) Patient is a 54 y.o. female presenting with pharyngitis and hyperglycemia. The history is provided by the patient and a relative. A language interpreter was used Estate manager/land agent are sons at bedside.).  Sore Throat Associated symptoms include chills, myalgias, nausea, a sore throat, vomiting and weakness. Pertinent negatives include no abdominal pain, chest pain, congestion, coughing or fever. Associated symptoms comments: Patient with a history of T2DM, insulin dependent, returns to the ED for worsening symptoms of painful swallowing, sore throat and left throat swelling. She also reports uncontrolled blood sugar. She has not been able to eat or drink at home secondary to painful swallowing but also because of significant nausea with morning vomiting. No diarrhea. No cough, congestion, diarrhea, dysuria..  Hyperglycemia Associated symptoms: nausea, vomiting and weakness   Associated symptoms: no abdominal pain, no chest pain, no fever and no shortness of breath     Past Medical History  Diagnosis Date  . Diabetes mellitus without complication    Past Surgical History  Procedure Laterality Date  . Eye surgery      L eye removed   No family history on file. History  Substance Use Topics  . Smoking status: Never Smoker   . Smokeless tobacco: Not on file  . Alcohol Use: No   OB History    No data available     Review of Systems  Constitutional: Positive for chills and appetite change. Negative for fever.  HENT: Positive for sore throat and trouble swallowing. Negative for congestion and ear pain.   Respiratory: Negative.  Negative for cough and shortness of breath.   Cardiovascular: Negative.   Negative for chest pain.  Gastrointestinal: Positive for nausea and vomiting. Negative for abdominal pain.  Genitourinary: Negative.   Musculoskeletal: Positive for myalgias and neck stiffness.       Left sided neck swelling/pain.  Skin: Negative.   Neurological: Positive for weakness.      Allergies  Review of patient's allergies indicates no known allergies.  Home Medications   Prior to Admission medications   Medication Sig Start Date End Date Taking? Authorizing Provider  amoxicillin-clavulanate (AUGMENTIN) 400-57 MG/5ML suspension 9ml po BID for 10 days.   Label in spanish 11/25/14   Shade Flood, MD  HYDROcodone-acetaminophen (HYCET) 7.5-325 mg/15 ml solution Take 10-15 mLs by mouth every 6 (six) hours as needed for moderate pain. Label in spanish 11/25/14 11/25/15  Shade Flood, MD  insulin lispro protamine-lispro (HUMALOG 75/25 MIX) (75-25) 100 UNIT/ML SUSP injection Inject 10 Units into the skin every morning.     Historical Provider, MD  metFORMIN (GLUCOPHAGE) 850 MG tablet Take 850 mg by mouth every evening.    Historical Provider, MD  ondansetron (ZOFRAN ODT) 4 MG disintegrating tablet  ODT q4 hours prn nausea/vomit 11/26/14   Pricilla Loveless, MD   BP 127/66 mmHg  Pulse 91  Temp(Src) 99.7 F (37.6 C) (Oral)  Resp 17  SpO2 98% Physical Exam  Constitutional: She is oriented to person, place, and time. She appears well-developed and well-nourished. No distress.  Nontoxic but uncomfortable appearing.  HENT:  Head: Normocephalic.  Right Ear: Tympanic membrane normal.  Left Ear: Tympanic membrane normal.  Mouth/Throat: Uvula is midline. Mucous membranes are dry.  No visualized peritonsillar abscess. No mastoid tenderness bilaterally. There is left neck fullness that is tender.   Neck: Normal range of motion. Neck supple.  Cardiovascular: Normal rate and regular rhythm.   No murmur heard. Pulmonary/Chest: Effort normal and breath sounds normal. She has no  wheezes. She has no rales.  Abdominal: Soft. Bowel sounds are normal. There is no tenderness. There is no rebound and no guarding.  Musculoskeletal: Normal range of motion.  Neurological: She is alert and oriented to person, place, and time.  Skin: Skin is warm and dry. No rash noted.  Psychiatric: She has a normal mood and affect.    ED Course  Procedures (including critical care time) Labs Review Labs Reviewed  CBC WITH DIFFERENTIAL/PLATELET - Abnormal; Notable for the following:    WBC 18.9 (*)    Neutrophils Relative % 85 (*)    Neutro Abs 16.0 (*)    Lymphocytes Relative 9 (*)    Monocytes Absolute 1.2 (*)    All other components within normal limits  COMPREHENSIVE METABOLIC PANEL - Abnormal; Notable for the following:    Sodium 132 (*)    CO2 16 (*)    Glucose, Bld 299 (*)    All other components within normal limits  CBG MONITORING, ED - Abnormal; Notable for the following:    Glucose-Capillary 274 (*)    All other components within normal limits    Imaging Review Ct Soft Tissue Neck W Contrast  11/26/2014   CLINICAL DATA:  Left neck swelling and pain and dysphagia for past 3 days.  EXAM: CT NECK WITH CONTRAST  TECHNIQUE: Multidetector CT imaging of the neck was performed using the standard protocol following the bolus administration of intravenous contrast.  CONTRAST:  75mL OMNIPAQUE IOHEXOL 300 MG/ML  SOLN  COMPARISON:  None.  FINDINGS: Asymmetric swelling and decreased attenuation is seen involving the left palatine tonsil, epiglottis, and retropharyngeal soft tissues. No focal fluid collection seen to suggest abscess formation.  Mild left level 2 jugular lymphadenopathy is seen with largest lymph node measuring up to 12 mm. No evidence of central necrosis. This is likely reactive in etiology. No other masses or lymphadenopathy identified within the neck.  IMPRESSION: Asymmetric edema involving the left palatine tonsil, epiglottis, and retropharyngeal soft tissues. No  discrete abscess identified.  Mild left level 2 jugular lymphadenopathy, likely reactive in etiology.   Electronically Signed   By: Myles Rosenthal M.D.   On: 11/26/2014 13:03     EKG Interpretation None      MDM   Final diagnoses:  None    1. Throat infection  Chart reviewed. CT performed yesterday is without discrete abscess. She has been at home unable to take antibiotics and so has failed outpatient treatment. Diabetic hyperglycemia in the setting of likely infection, increasing leukocytosis. Feel she needs admission for further treatment.   Patient is stable. IV antibiotics provided. She is in NAD and non-toxic in appearance. Discussed with Internal Medicine who accepts for admission.    Elpidio Anis, PA-C 11/28/14 0555  Laurence Spates, MD 12/01/14 [phone]

## 2014-11-27 NOTE — ED Notes (Signed)
PA at the bedside.

## 2014-11-28 ENCOUNTER — Inpatient Hospital Stay (HOSPITAL_COMMUNITY): Payer: Self-pay

## 2014-11-28 ENCOUNTER — Encounter (HOSPITAL_COMMUNITY)
Admission: EM | Disposition: A | Discharge status: Home / Self Care | Payer: Self-pay | Source: Home / Self Care | Attending: Pulmonary Disease

## 2014-11-28 ENCOUNTER — Inpatient Hospital Stay (HOSPITAL_COMMUNITY): Payer: Self-pay | Admitting: Anesthesiology

## 2014-11-28 ENCOUNTER — Encounter (HOSPITAL_COMMUNITY): Payer: Self-pay | Admitting: Radiology

## 2014-11-28 DIAGNOSIS — E1165 Type 2 diabetes mellitus with hyperglycemia: Secondary | ICD-10-CM | POA: Insufficient documentation

## 2014-11-28 DIAGNOSIS — J029 Acute pharyngitis, unspecified: Secondary | ICD-10-CM

## 2014-11-28 DIAGNOSIS — J390 Retropharyngeal and parapharyngeal abscess: Principal | ICD-10-CM

## 2014-11-28 DIAGNOSIS — IMO0002 Reserved for concepts with insufficient information to code with codable children: Secondary | ICD-10-CM | POA: Insufficient documentation

## 2014-11-28 DIAGNOSIS — A419 Sepsis, unspecified organism: Secondary | ICD-10-CM | POA: Diagnosis present

## 2014-11-28 HISTORY — PX: INTUBATION-ENDOTRACHEAL WITH TRACHEOSTOMY STANDBY: SHX6592

## 2014-11-28 HISTORY — PX: DIRECT LARYNGOSCOPY: SHX5326

## 2014-11-28 LAB — BASIC METABOLIC PANEL
Anion gap: 12 (ref 5–15)
BUN: 10 mg/dL (ref 6–20)
CALCIUM: 8.4 mg/dL — AB (ref 8.9–10.3)
CO2: 17 mmol/L — ABNORMAL LOW (ref 22–32)
Chloride: 107 mmol/L (ref 101–111)
Creatinine, Ser: 0.6 mg/dL (ref 0.44–1.00)
GFR calc Af Amer: >60 mL/min (ref >60)
GFR calc non Af Amer: >60 mL/min (ref >60)
GLUCOSE: 194 mg/dL — AB (ref 65–99)
Potassium: 3.2 mmol/L — ABNORMAL LOW (ref 3.5–5.1)
SODIUM: 136 mmol/L (ref 135–145)

## 2014-11-28 LAB — URINALYSIS, ROUTINE W REFLEX MICROSCOPIC
Glucose, UA: >1000 mg/dL — AB
Hgb urine dipstick: NEGATIVE
Ketones, ur: >80 mg/dL — AB
Leukocytes, UA: NEGATIVE
Nitrite: NEGATIVE
Protein, ur: 100 mg/dL — AB
SPECIFIC GRAVITY, URINE: 1.033 — AB (ref 1.005–1.030)
Urobilinogen, UA: 0.2 mg/dL (ref 0.0–1.0)
pH: 5.5 (ref 5.0–8.0)

## 2014-11-28 LAB — CBC
HEMATOCRIT: 34.8 % — AB (ref 36.0–46.0)
Hemoglobin: 11.6 g/dL — ABNORMAL LOW (ref 12.0–15.0)
MCH: 28.7 pg (ref 26.0–34.0)
MCHC: 33.3 g/dL (ref 30.0–36.0)
MCV: 86.1 fL (ref 78.0–100.0)
Platelets: 243 10*3/uL (ref 150–400)
RBC: 4.04 MIL/uL (ref 3.87–5.11)
RDW: 13.1 % (ref 11.5–15.5)
WBC: 15.7 10*3/uL — ABNORMAL HIGH (ref 4.0–10.5)

## 2014-11-28 LAB — BLOOD GAS, ARTERIAL
Acid-base deficit: 8.2 mmol/L — ABNORMAL HIGH (ref 0.0–2.0)
Bicarbonate: 16.6 mEq/L — ABNORMAL LOW (ref 20.0–24.0)
Drawn by: 418751
FIO2: 100
MECHVT: 380 mL
O2 Saturation: 99.7 %
PCO2 ART: 32.6 mmHg — AB (ref 35.0–45.0)
PEEP: 5 cmH2O
PO2 ART: 480 mmHg — AB (ref 80.0–100.0)
Patient temperature: 98.6
RATE: 16 resp/min
TCO2: 17.6 mmol/L (ref 0–100)
pH, Arterial: 7.327 — ABNORMAL LOW (ref 7.350–7.450)

## 2014-11-28 LAB — MRSA PCR SCREENING: MRSA by PCR: NEGATIVE

## 2014-11-28 LAB — GLUCOSE, CAPILLARY
Glucose-Capillary: 165 mg/dL — ABNORMAL HIGH (ref 65–99)
Glucose-Capillary: 171 mg/dL — ABNORMAL HIGH (ref 65–99)
Glucose-Capillary: 190 mg/dL — ABNORMAL HIGH (ref 65–99)

## 2014-11-28 LAB — PROCALCITONIN: Procalcitonin: <0.1 ng/mL

## 2014-11-28 LAB — URINE MICROSCOPIC-ADD ON

## 2014-11-28 SURGERY — INTUBATION-ENDOTRACHEAL WITH TRACHEOSTOMY STANDBY
Anesthesia: General | Site: Mouth

## 2014-11-28 MED ORDER — POTASSIUM CHLORIDE 10 MEQ/100ML IV SOLN: 10.0000 meq | INTRAVENOUS | Status: DC

## 2014-11-28 MED ORDER — MORPHINE SULFATE 2 MG/ML IJ SOLN
2.0000 mg | Freq: Once | INTRAMUSCULAR | Status: AC
  Administered 2014-11-28: 2 mg via INTRAVENOUS
  Filled 2014-11-28: qty 1

## 2014-11-28 MED ORDER — METHYLPREDNISOLONE SODIUM SUCC 40 MG IJ SOLR
40.0000 mg | Freq: Three times a day (TID) | INTRAMUSCULAR | Status: DC
  Administered 2014-11-28 – 2014-12-02 (×11): 40 mg via INTRAVENOUS
  Filled 2014-11-28 (×14): qty 1

## 2014-11-28 MED ORDER — POTASSIUM CHLORIDE 20 MEQ/15ML (10%) PO SOLN
40.0000 meq | Freq: Once | ORAL | Status: AC
Start: 2014-11-28 — End: 2014-11-28
  Administered 2014-11-28: 40 meq via ORAL
  Filled 2014-11-28: qty 30

## 2014-11-28 MED ORDER — LACTATED RINGERS IV SOLN
INTRAVENOUS | Status: DC | PRN
  Administered 2014-11-28 (×2): via INTRAVENOUS

## 2014-11-28 MED ORDER — MIDAZOLAM HCL 5 MG/5ML IJ SOLN
INTRAMUSCULAR | Status: DC | PRN
  Administered 2014-11-28: 2 mg via INTRAVENOUS

## 2014-11-28 MED ORDER — 0.9 % SODIUM CHLORIDE (POUR BTL) OPTIME
TOPICAL | Status: DC | PRN
  Administered 2014-11-28: 1000 mL

## 2014-11-28 MED ORDER — FENTANYL CITRATE (PF) 100 MCG/2ML IJ SOLN
25.0000 ug | INTRAMUSCULAR | Status: DC | PRN
  Administered 2014-11-29 – 2014-11-30 (×9): 50 ug via INTRAVENOUS
  Filled 2014-11-28 (×10): qty 2

## 2014-11-28 MED ORDER — DEXTROSE 5 % IV SOLN
0.0000 ug/min | INTRAVENOUS | Status: DC
  Filled 2014-11-28: qty 1

## 2014-11-28 MED ORDER — PANTOPRAZOLE SODIUM 40 MG IV SOLR
40.0000 mg | Freq: Every day | INTRAVENOUS | Status: DC
  Administered 2014-11-28 – 2014-12-01 (×4): 40 mg via INTRAVENOUS
  Filled 2014-11-28 (×5): qty 40

## 2014-11-28 MED ORDER — SODIUM CHLORIDE 0.9 % IV SOLN
1.5000 g | Freq: Four times a day (QID) | INTRAVENOUS | Status: DC
  Administered 2014-11-28 – 2014-12-03 (×19): 1.5 g via INTRAVENOUS
  Filled 2014-11-28 (×25): qty 1.5

## 2014-11-28 MED ORDER — IOHEXOL 300 MG/ML  SOLN
75.0000 mL | Freq: Once | INTRAMUSCULAR | Status: AC | PRN
  Administered 2014-11-28: 75 mL via INTRAVENOUS

## 2014-11-28 MED ORDER — PROPOFOL INFUSION 10 MG/ML OPTIME
INTRAVENOUS | Status: DC | PRN
  Administered 2014-11-28: 25 ug/kg/min via INTRAVENOUS

## 2014-11-28 MED ORDER — LIDOCAINE-EPINEPHRINE 1 %-1:100000 IJ SOLN
INTRAMUSCULAR | Status: AC
  Filled 2014-11-28: qty 1

## 2014-11-28 MED ORDER — ALBUTEROL SULFATE (2.5 MG/3ML) 0.083% IN NEBU
2.5000 mg | INHALATION_SOLUTION | RESPIRATORY_TRACT | Status: DC
  Administered 2014-11-28 – 2014-12-02 (×20): 2.5 mg via RESPIRATORY_TRACT
  Filled 2014-11-28 (×19): qty 3

## 2014-11-28 MED ORDER — LIDOCAINE HCL (CARDIAC) 20 MG/ML IV SOLN
INTRAVENOUS | Status: DC | PRN
  Administered 2014-11-28: 60 mg via INTRAVENOUS

## 2014-11-28 MED ORDER — PROPOFOL 10 MG/ML IV BOLUS
INTRAVENOUS | Status: DC | PRN
  Administered 2014-11-28: 40 mg via INTRAVENOUS
  Administered 2014-11-28 (×2): 20 mg via INTRAVENOUS

## 2014-11-28 MED ORDER — FENTANYL CITRATE (PF) 100 MCG/2ML IJ SOLN
INTRAMUSCULAR | Status: DC | PRN
  Administered 2014-11-28: 100 ug via INTRAVENOUS
  Administered 2014-11-28: 50 ug via INTRAVENOUS
  Administered 2014-11-28: 100 ug via INTRAVENOUS

## 2014-11-28 MED ORDER — SODIUM CHLORIDE 0.9 % IV SOLN
250.0000 mL | INTRAVENOUS | Status: DC | PRN
  Administered 2014-11-28: 250 mL via INTRAVENOUS

## 2014-11-28 MED ORDER — PROPOFOL 1000 MG/100ML IV EMUL
5.0000 ug/kg/min | INTRAVENOUS | Status: DC
  Administered 2014-11-28 – 2014-11-29 (×2): 25 ug/kg/min via INTRAVENOUS
  Administered 2014-11-29: 30 ug/kg/min via INTRAVENOUS
  Administered 2014-11-30: 49.652 ug/kg/min via INTRAVENOUS
  Administered 2014-11-30: 45 ug/kg/min via INTRAVENOUS
  Administered 2014-11-30: 35 ug/kg/min via INTRAVENOUS
  Administered 2014-12-01: 50 ug/kg/min via INTRAVENOUS
  Administered 2014-12-01: 45 ug/kg/min via INTRAVENOUS
  Filled 2014-11-28 (×11): qty 100

## 2014-11-28 MED ORDER — PHENYLEPHRINE HCL 10 MG/ML IJ SOLN
10.0000 mg | INTRAVENOUS | Status: DC | PRN
  Administered 2014-11-28: 10 ug/min via INTRAVENOUS

## 2014-11-28 SURGICAL SUPPLY — 11 items
BLADE SURG 15 STRL LF DISP TIS (BLADE) ×1 IMPLANT
BLADE SURG 15 STRL SS (BLADE) ×2
COVER MAYO STAND STRL (DRAPES) ×3 IMPLANT
COVER SURGICAL LIGHT HANDLE (MISCELLANEOUS) ×3 IMPLANT
ELECT COATED BLADE 2.86 ST (ELECTRODE) ×3 IMPLANT
GLOVE BIO SURGEON STRL SZ7.5 (GLOVE) ×3 IMPLANT
GLOVE BIOGEL M 7.0 STRL (GLOVE) ×3 IMPLANT
KIT BASIN OR (CUSTOM PROCEDURE TRAY) ×3 IMPLANT
PENCIL BUTTON HOLSTER BLD 10FT (ELECTRODE) ×3 IMPLANT
TRAY ENT MC OR (CUSTOM PROCEDURE TRAY) ×3 IMPLANT
YANKAUER SUCT BULB TIP NO VENT (SUCTIONS) ×3 IMPLANT

## 2014-11-28 NOTE — Consult Note (Signed)
ENT CONSULT:  Reason for Consult: Airway swelling and dysphagia Referring Physician: Luke is an 54 y.o. female.  HPI: The patient was admitted for increasing symptoms of sore throat and difficulty swallowing. She has a history of insulin-dependent diabetes and over the last 4 days has had increased difficulty drinking fluids. Start on oral antibiotics she was unable to tolerate her medication and was thus untreated. No prior history of dysphagia, swallowing difficulty or significant infection. The patient was admitted to the Teaching Service and initial CT scan showed significant swelling in the retropharyngeal region, patient started on intravenous antibiotics with an overall improvement in initial symptoms. This afternoon the patient noted increasing difficulty swallowing, no active respiratory issues or stridor, repeat CT scan showed increased swelling in the posterior pharynx with a significant fluid collection immediately superior to the larynx.  Past Medical History  Diagnosis Date  . Diabetes mellitus without complication     Past Surgical History  Procedure Laterality Date  . Eye surgery      L eye removed    History reviewed. No pertinent family history.  Social History:  reports that she has never smoked. She does not have any smokeless tobacco history on file. She reports that she does not drink alcohol or use illicit drugs.  Allergies: No Known Allergies  Medications: I have reviewed the patient's current medications.  Results for orders placed or performed during the hospital encounter of 11/27/14 (from the past 48 hour(s))  CBG monitoring, ED     Status: Abnormal   Collection Time: 11/27/14  7:48 PM  Result Value Ref Range   Glucose-Capillary 274 (H) 65 - 99 mg/dL  CBC with Differential     Status: Abnormal   Collection Time: 11/27/14  7:51 PM  Result Value Ref Range   WBC 18.9 (H) 4.0 - 10.5 K/uL   RBC 4.44 3.87 - 5.11 MIL/uL    Hemoglobin 12.8 12.0 - 15.0 g/dL   HCT 37.7 36.0 - 46.0 %   MCV 84.9 78.0 - 100.0 fL   MCH 28.8 26.0 - 34.0 pg   MCHC 34.0 30.0 - 36.0 g/dL   RDW 12.9 11.5 - 15.5 %   Platelets 267 150 - 400 K/uL   Neutrophils Relative % 85 (H) 43 - 77 %   Neutro Abs 16.0 (H) 1.7 - 7.7 K/uL   Lymphocytes Relative 9 (L) 12 - 46 %   Lymphs Abs 1.7 0.7 - 4.0 K/uL   Monocytes Relative 6 3 - 12 %   Monocytes Absolute 1.2 (H) 0.1 - 1.0 K/uL   Eosinophils Relative 0 0 - 5 %   Eosinophils Absolute 0.0 0.0 - 0.7 K/uL   Basophils Relative 0 0 - 1 %   Basophils Absolute 0.0 0.0 - 0.1 K/uL  Comprehensive metabolic panel     Status: Abnormal   Collection Time: 11/27/14  7:51 PM  Result Value Ref Range   Sodium 132 (L) 135 - 145 mmol/L   Potassium 3.6 3.5 - 5.1 mmol/L   Chloride 101 101 - 111 mmol/L   CO2 16 (L) 22 - 32 mmol/L   Glucose, Bld 299 (H) 65 - 99 mg/dL   BUN 15 6 - 20 mg/dL   Creatinine, Ser 0.97 0.44 - 1.00 mg/dL   Calcium 9.2 8.9 - 10.3 mg/dL   Total Protein 7.6 6.5 - 8.1 g/dL   Albumin 3.5 3.5 - 5.0 g/dL   AST 15 15 - 41 U/L   ALT  14 14 - 54 U/L   Alkaline Phosphatase 102 38 - 126 U/L   Total Bilirubin 1.2 0.3 - 1.2 mg/dL   GFR calc non Af Amer >60 >60 mL/min   GFR calc Af Amer >60 >60 mL/min    Comment: (NOTE) The eGFR has been calculated using the CKD EPI equation. This calculation has not been validated in all clinical situations. eGFR's persistently <60 mL/min signify possible Chronic Kidney Disease.    Anion gap 15 5 - 15  Lactic acid, plasma     Status: None   Collection Time: 11/27/14 10:10 PM  Result Value Ref Range   Lactic Acid, Venous 1.1 0.5 - 2.0 mmol/L  Glucose, capillary     Status: Abnormal   Collection Time: 11/27/14 11:00 PM  Result Value Ref Range   Glucose-Capillary 198 (H) 65 - 99 mg/dL  Urinalysis, Routine w reflex microscopic (not at [facility])     Status: Abnormal   Collection Time: 11/28/14  1:59 AM  Result Value Ref Range   Color, Urine YELLOW YELLOW    APPearance CLEAR CLEAR   Specific Gravity, Urine 1.033 (H) 1.005 - 1.030   pH 5.5 5.0 - 8.0   Glucose, UA >1000 (A) NEGATIVE mg/dL   Hgb urine dipstick NEGATIVE NEGATIVE   Bilirubin Urine SMALL (A) NEGATIVE   Ketones, ur >80 (A) NEGATIVE mg/dL   Protein, ur 100 (A) NEGATIVE mg/dL   Urobilinogen, UA 0.2 0.0 - 1.0 mg/dL   Nitrite NEGATIVE NEGATIVE   Leukocytes, UA NEGATIVE NEGATIVE  Urine microscopic-add on     Status: Abnormal   Collection Time: 11/28/14  1:59 AM  Result Value Ref Range   Squamous Epithelial / LPF FEW (A) RARE   WBC, UA 3-6 <3 WBC/hpf   RBC / HPF 0-2 <3 RBC/hpf   Bacteria, UA FEW (A) RARE   Casts HYALINE CASTS (A) NEGATIVE   Urine-Other FEW YEAST   Basic metabolic panel     Status: Abnormal   Collection Time: 11/28/14  5:55 AM  Result Value Ref Range   Sodium 136 135 - 145 mmol/L   Potassium 3.2 (L) 3.5 - 5.1 mmol/L   Chloride 107 101 - 111 mmol/L   CO2 17 (L) 22 - 32 mmol/L   Glucose, Bld 194 (H) 65 - 99 mg/dL   BUN 10 6 - 20 mg/dL   Creatinine, Ser 0.60 0.44 - 1.00 mg/dL   Calcium 8.4 (L) 8.9 - 10.3 mg/dL   GFR calc non Af Amer >60 >60 mL/min   GFR calc Af Amer >60 >60 mL/min    Comment: (NOTE) The eGFR has been calculated using the CKD EPI equation. This calculation has not been validated in all clinical situations. eGFR's persistently <60 mL/min signify possible Chronic Kidney Disease.    Anion gap 12 5 - 15  CBC     Status: Abnormal   Collection Time: 11/28/14  5:55 AM  Result Value Ref Range   WBC 15.7 (H) 4.0 - 10.5 K/uL   RBC 4.04 3.87 - 5.11 MIL/uL   Hemoglobin 11.6 (L) 12.0 - 15.0 g/dL   HCT 34.8 (L) 36.0 - 46.0 %   MCV 86.1 78.0 - 100.0 fL   MCH 28.7 26.0 - 34.0 pg   MCHC 33.3 30.0 - 36.0 g/dL   RDW 13.1 11.5 - 15.5 %   Platelets 243 150 - 400 K/uL  Glucose, capillary     Status: Abnormal   Collection Time: 11/28/14  6:59 AM  Result Value  Ref Range   Glucose-Capillary 190 (H) 65 - 99 mg/dL  Glucose, capillary     Status: Abnormal    Collection Time: 11/28/14 11:08 AM  Result Value Ref Range   Glucose-Capillary 165 (H) 65 - 99 mg/dL   Comment 1 Notify RN    Comment 2 Document in Chart     Ct Soft Tissue Neck W Contrast  11/28/2014   CLINICAL DATA:  Neck pain. Trouble swallowing. Patient feels like her throat is closing up.  EXAM: CT NECK WITH CONTRAST  TECHNIQUE: Multidetector CT imaging of the neck was performed using the standard protocol following the bolus administration of intravenous contrast.  CONTRAST:  53m OMNIPAQUE IOHEXOL 300 MG/ML  SOLN  COMPARISON:  11/26/2014.  FINDINGS: Findings are similar to the recent prior CT. There is heterogeneous, predominantly low attenuation edema/cellulitis that extends from the soft palate superiorly, surrounds the palatine tonsils, left greater than right, and contacts the submandibular glands, greater on the left. The inflammatory changes extend to the false cords and area for got folds and chest to the superior margin of the true vocal cords. There is a more defined retropharyngeal fluid collection, which measures 2 cm x 1.9 cm x 2.1 cm, which has mildly increased in size from the prior study and is better defined. It previously measured approximately 16 mm x 12 mm x 16 mm. The oral pharyngeal airway is narrowed anterior to this collection, similar to the prior exam. The upper laryngeal airway is also narrowed, similar to the prior study. Hazy inflammatory changes seen in the subcutaneous fat of the submental region.  Superior to the level of the soft palate, the parapharyngeal spaces are unremarkable. The masticator spaces are also unremarkable.  Salivary glands: There is heterogeneous attenuation of the left submandibular gland with more homogeneous attenuation of the right submandibular gland. Left submandibular gland is mildly larger than the right. This may be reactive to the adjacent inflammation. It could potentially be origin, felt less likely. The parotid glands are unremarkable.   Thyroid: Small ill-defined hypo attenuating lesions are noted consistent with sub cm nodules, stable.  Lymph nodes: Mildly enlarged left sided lymph nodes are noted. There is an 11 mm short axis level 2 jugulodigastric node. A slightly larger node is seen just below this on the left measuring just over 12 mm in short axis. These are stable. No new adenopathy.  Vascular: Unremarkable.  Limited intracranial: Unremarkable.  Visualized orbits: Unremarkable.  Mastoids and visualized paranasal sinuses: Clear.  Skeleton: No significant abnormality. No bone resorption is seen to suggest osteomyelitis.  Upper chest: No upper mediastinal mass or enlarged lymph node. Visualized upper lungs are clear.  IMPRESSION: 1. Slight worsening when compared to the prior CT. 2. There are persistent, extensive inflammatory changes surrounding and involving the oral pharynx and larynx, left greater than right. Inflammatory changes are contiguous with the left submandibular gland, which is also heterogeneous in attenuation and slightly larger than the right submandibular gland. 3. There is a retropharyngeal collection consistent with an abscess, which is better defined and larger than it was previously. Currently it measures 2 x 1.9 x 2.1 cm. No other discrete collection. 4. Oral pharyngeal and upper laryngeal airway is narrowed similar to the prior study.   Electronically Signed   By: DLajean ManesM.D.   On: 11/28/2014 16:10    ROS:ROS 12 systems reviewed and negative except as stated in HPI   Blood pressure 131/60, pulse 73, temperature 98.2 F (36.8 C), temperature source  Oral, resp. rate 15, height _0  (1.549 m), weight 57.335 kg (126 lb 6.4 oz), SpO2 98 %.  PHYSICAL EXAM: General appearance - alert, well appearing, and in no distress, no stridor Nose - normal and patent, no erythema, discharge or polyps Mouth - mucous membranes moist, pharynx normal without lesions Neck - supple, no significant adenopathy  Flexible  Laryngoscopy: 4 mm flexible laryngoscope inserted without difficulty to the left nostril, patent nasal passageway and nasopharynx. Moderate purulent secretions in the pharynx with significant supraglottic edema, erythema and swelling area no stridor and vocal cords normal. Post glottic swelling particular on the left consistent with possible retropharyngeal versus arytenoid fluid collection.  Studies Reviewed: Above CT scan  Assessment/Plan: ENT service consult and for progressive symptoms of dysphagia and CT findings of retropharyngeal swelling. Flexible laryngoscopy shows significant soft tissue swelling which may be consistent with retropharyngeal fluid collection versus possible infected arytenoid cyst. Recommend emergency intubation for airway management with possible tracheostomy. Plan direct laryngoscopy examination of the airway and possible incision and drainage of fluid collection. Risk and benefits of these procedures were discussed in detail with the patient's family, her son served as interpreter speaking excellent English, they understood and agreed with our plan.     [place], Tonjia Parillo 11/28/2014, 5:36 PM

## 2014-11-28 NOTE — Anesthesia Procedure Notes (Signed)
Procedure Name: Intubation Date/Time: 11/28/2014 5:55 PM Performed by: Coralee Rud Pre-anesthesia Checklist: Patient identified, Emergency Drugs available, Suction available and Patient being monitored Patient Re-evaluated:Patient Re-evaluated prior to inductionOxygen Delivery Method: Circle system utilized Preoxygenation: Pre-oxygenation with 100% oxygen Intubation Type: IV induction and Inhalational induction Ventilation: Mask ventilation without difficulty Laryngoscope Size: Glidescope Grade View: Grade II Tube type: Oral Tube size: 7.0 mm Number of attempts: 1 Airway Equipment and Method: Video-laryngoscopy Placement Confirmation: ETT inserted through vocal cords under direct vision,  positive ETCO2,  CO2 detector and breath sounds checked- equal and bilateral Secured at: 22 cm Comments: O2 @ 3l/m nasal cannula.  Cetacaine spray to tongue and pharynx.  Patient in no distress.  Glide scope visualization, Grade 2 view.  Bougie passed int trachea and #7 ETT passed easily into trachea.  BBS, chest and CO2.

## 2014-11-28 NOTE — Progress Notes (Signed)
MD at bedside, consent obtained for emergent intubation. Pt family aware and at bedside, questions answered and education given. Will continue to monitor.

## 2014-11-28 NOTE — Consult Note (Addendum)
PULMONARY / CRITICAL CARE MEDICINE   Name: Brooke Dorsey MRN: [id number] DOB: 07-05-1960    ADMISSION DATE:  11/27/2014 CONSULTATION DATE:  11/28/14  REFERRING MD :  Dr. Annalee Genta  CHIEF COMPLAINT:  Recurrent oropharyngeal/tonsillar cellulitis  INITIAL PRESENTATION: 54 year old female with known history of diabetes presented with a four-day history of worsening left neck pain & sore throat. Patient given IM Rocephin on July 30 but per records unable to tolerate oral antibiotics.  STUDIES:  CT Neck/Soft Tissue (11/28/14): Slight worsening with extensive inflammatory changes surrounding the oropharynx and larynx left greater than right. Inflammatory changes contiguous left submandibular gland. Suggestion of retropharyngeal collection consistent with abscess.  SIGNIFICANT EVENTS: 8/2 - OR exam & intubation w/ tonsilar biopsy  HISTORY OF PRESENT ILLNESS:  Patient currently intubated postoperatively and sedated. No family at bedside to obtain history from. History obtained from electronic medical record. Patient had a four-day history of sore throat with a negative rapid strep screen on July 30. She was unable to tolerate oral antibiotics and had no stridor or dyspnea on presentation. Patient began to have difficulty managing her oral secretions by the afternoon and a repeat CT scan was performed of her soft tissues. There was concern for a retropharyngeal abscess and patient was electively intubated and taken to the operating room by Dr. Annalee Genta. No such abscess was found. Tonsillar biopsy was performed. The patient was transitioned to the intensive care unit for further management and continued endotracheal agitation daily protection. Admission the patient's antibiotics were switched to clindamycin but the patient did not truly failed outpatient therapy with Augmentin.  PAST MEDICAL HISTORY :   has a past medical history of Diabetes mellitus without complication.  has past surgical history  that includes Eye surgery. Prior to Admission medications   Medication Sig Start Date End Date Taking? Authorizing Provider  amoxicillin-clavulanate (AUGMENTIN) 400-57 MG/5ML suspension 9ml po BID for 10 days.   Label in spanish Patient taking differently: See admin instructions. 9ml po BID for 10 days.   Label in spanish 11/25/14  Yes Shade Flood, MD  clindamycin (CLEOCIN) 300 MG capsule Take 300 mg by mouth 4 (four) times daily. Take for 10 days started on 11-27-14   Yes Historical Provider, MD  HYDROcodone-acetaminophen (HYCET) 7.5-325 mg/15 ml solution Take 10-15 mLs by mouth every 6 (six) hours as needed for moderate pain. Label in spanish Patient taking differently: Take 10-45 mLs by mouth every 6 (six) hours as needed for moderate pain. Label in spanish 11/25/14 11/25/15 Yes Shade Flood, MD  insulin lispro protamine-lispro (HUMALOG 75/25 MIX) (75-25) 100 UNIT/ML SUSP injection Inject 10 Units into the skin every morning.    Yes Historical Provider, MD  ondansetron (ZOFRAN-ODT) 4 MG disintegrating tablet Take 4 mg by mouth every 4 (four) hours as needed for nausea or vomiting.   Yes Historical Provider, MD  ondansetron (ZOFRAN ODT) 4 MG disintegrating tablet 4mg  ODT q4 hours prn nausea/vomit Patient not taking: Reported on 11/27/2014 11/26/14   Pricilla Loveless, MD   No Known Allergies  FAMILY HISTORY:  has no family status information on file.  SOCIAL HISTORY:  reports that she has never smoked. She does not have any smokeless tobacco history on file. She reports that she does not drink alcohol or use illicit drugs.  REVIEW OF SYSTEMS:  Unobtainable as the patient is currently intubated and sedated.  SUBJECTIVE:   VITAL SIGNS: Temp:  [98.1 F (36.7 C)-99.9 F (37.7 C)] 98.7 F (37.1 C) (08/02 1852) Pulse  Rate:  [71-99] 89 (08/02 1852) Resp:  [15-20] 20 (08/02 1852) BP: (113-141)/(51-74) 141/70 mmHg (08/02 1852) SpO2:  [96 %-100 %] 100 % (08/02 1852) FiO2 (%):  [100 %] 100  % (08/02 1850) Weight:  [126 lb 6.4 oz (57.335 kg)] 126 lb 6.4 oz (57.335 kg) (08/01 2257) HEMODYNAMICS:   VENTILATOR SETTINGS: Vent Mode:  [-] PRVC FiO2 (%):  [100 %] 100 % Set Rate:  [16 bmp] 16 bmp Vt Set:  [380 mL] 380 mL PEEP:  [5 cmH20] 5 cmH20 Plateau Pressure:  [14 cmH20] 14 cmH20 INTAKE / OUTPUT:  Intake/Output Summary (Last 24 hours) at 11/28/14 1923 Last data filed at 11/28/14 1845  Gross per 24 hour  Intake   1000 ml  Output      0 ml  Net   1000 ml    PHYSICAL EXAMINATION: General:  Sedated. No acute distress. Just arrived from operating room.  Integument:  Warm & dry. No rash on exposed skin. No bruising. Lymphatics:  No appreciated cervical or supraclavicular lymphadenoapthy. 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 Endotracheal tube in place. No scleral injection or icterus. Cardiovascular:  Regular rate. No edema. No appreciable JVD.  Pulmonary:  Good aeration & clear to auscultation bilaterally. Symmetric chest wall rise. No accessory muscle use on ventilator . Abdomen: Soft. Normal bowel sounds. Nondistended.  Musculoskeletal:  Normal bulk. No joint deformity or effusion appreciated. Neurological:  Moving all 4 extremities spontaneously with painful stimuli. Left eye enucleated. Sedated. Psychiatric:  Unable to assess as patient is currently intubated and sedated with propofol and sedated.  LABS:  CBC  Recent Labs Lab 11/26/14 0905 11/26/14 0916 11/27/14 1951 11/28/14 0555  WBC 16.4*  --  18.9* 15.7*  HGB 13.4 14.3 12.8 11.6*  HCT 39.2 42.0 37.7 34.8*  PLT 223  --  267 243   Coag's No results for input(s): APTT, INR in the last 168 hours. BMET  Recent Labs Lab 11/26/14 0905 11/26/14 0916 11/27/14 1951 11/28/14 0555  NA 133* 133* 132* 136  K 3.8 3.9 3.6 3.2*  CL 95* 97* 101 107  CO2 24  --  16* 17*  BUN 7 7 15 10   CREATININE 0.73 0.60 0.97 0.60  GLUCOSE 318* 321* 299* 194*   Electrolytes  Recent Labs Lab  11/26/14 0905 11/27/14 1951 11/28/14 0555  CALCIUM 9.1 9.2 8.4*   Sepsis Markers  Recent Labs Lab 11/27/14 2210  LATICACIDVEN 1.1   ABG No results for input(s): PHART, PCO2ART, PO2ART in the last 168 hours. Liver Enzymes  Recent Labs Lab 11/26/14 0905 11/27/14 1951  AST 17 15  ALT 17 14  ALKPHOS 102 102  BILITOT 1.4* 1.2  ALBUMIN 3.4* 3.5   Cardiac Enzymes No results for input(s): TROPONINI, PROBNP in the last 168 hours. Glucose  Recent Labs Lab 11/26/14 0830 11/26/14 1158 11/27/14 1948 11/27/14 2300 11/28/14 0659 11/28/14 1108  GLUCAP 269* 237* 274* 198* 190* 165*    Imaging Ct Soft Tissue Neck W Contrast  11/28/2014   CLINICAL DATA:  Neck pain. Trouble swallowing. Patient feels like her throat is closing up.  EXAM: CT NECK WITH CONTRAST  TECHNIQUE: Multidetector CT imaging of the neck was performed using the standard protocol following the bolus administration of intravenous contrast.  CONTRAST:  75mL OMNIPAQUE IOHEXOL 300 MG/ML  SOLN  COMPARISON:  11/26/2014.  FINDINGS: Findings are similar to the recent prior CT. There is heterogeneous, predominantly low attenuation edema/cellulitis that extends from the soft palate superiorly, surrounds  the palatine tonsils, left greater than right, and contacts the submandibular glands, greater on the left. The inflammatory changes extend to the false cords and area for got folds and chest to the superior margin of the true vocal cords. There is a more defined retropharyngeal fluid collection, which measures 2 cm x 1.9 cm x 2.1 cm, which has mildly increased in size from the prior study and is better defined. It previously measured approximately 16 mm x 12 mm x 16 mm. The oral pharyngeal airway is narrowed anterior to this collection, similar to the prior exam. The upper laryngeal airway is also narrowed, similar to the prior study. Hazy inflammatory changes seen in the subcutaneous fat of the submental region.  Superior to the level  of the soft palate, the parapharyngeal spaces are unremarkable. The masticator spaces are also unremarkable.  Salivary glands: There is heterogeneous attenuation of the left submandibular gland with more homogeneous attenuation of the right submandibular gland. Left submandibular gland is mildly larger than the right. This may be reactive to the adjacent inflammation. It could potentially be origin, felt less likely. The parotid glands are unremarkable.  Thyroid: Small ill-defined hypo attenuating lesions are noted consistent with sub cm nodules, stable.  Lymph nodes: Mildly enlarged left sided lymph nodes are noted. There is an 11 mm short axis level 2 jugulodigastric node. A slightly larger node is seen just below this on the left measuring just over 12 mm in short axis. These are stable. No new adenopathy.  Vascular: Unremarkable.  Limited intracranial: Unremarkable.  Visualized orbits: Unremarkable.  Mastoids and visualized paranasal sinuses: Clear.  Skeleton: No significant abnormality. No bone resorption is seen to suggest osteomyelitis.  Upper chest: No upper mediastinal mass or enlarged lymph node. Visualized upper lungs are clear.  IMPRESSION: 1. Slight worsening when compared to the prior CT. 2. There are persistent, extensive inflammatory changes surrounding and involving the oral pharynx and larynx, left greater than right. Inflammatory changes are contiguous with the left submandibular gland, which is also heterogeneous in attenuation and slightly larger than the right submandibular gland. 3. There is a retropharyngeal collection consistent with an abscess, which is better defined and larger than it was previously. Currently it measures 2 x 1.9 x 2.1 cm. No other discrete collection. 4. Oral pharyngeal and upper laryngeal airway is narrowed similar to the prior study.   Electronically Signed   By: Amie Portland M.D.   On: 11/28/2014 16:10     ASSESSMENT / PLAN:  PULMONARY OETT 7.0 8/2  >> A: Intubation for airway protection/preoperative  P:   Vent bundle Daily SBT Albuterol scheduled Solu-Medrol IV  CARDIOVASCULAR A:  Hypotension postoperative  P:  Wean Neosynephrine for MAP >65 & SBP >90 Monitor on telemetry  RENAL A:   Hypokalemia - Replacing IV.  P:   Place foley catheter Monitor UOP Daily BUN, Creatinine, & Electrolytes KCl 10 mEq IV x4 runs  GASTROINTESTINAL A:   No acute issues  P:   NPO Protonix IV for prophylaxis  HEMATOLOGIC A:   Anemia - no signs of active bleeding  P:  Monitor hemoglobin daily with CBC Trending Leukocytosis Lovenox & SCD for DVT prophylaxis  INFECTIOUS A:   Retropharyngeal/tonsillar cellulitis  P:   Group A Strep (7/30):  Negative  Abx:  Clindamycin 8/1 >> 8/2 Unasyn 8/2 >>  ENDOCRINE A:   DM-II    P:   Hemoglobin A1c pending Sliding-scale insulin with Accu-Cheks every 4 hours  NEUROLOGIC A:  No acute issues  P:   RASS goal: 0 to -1 Continue propofol for sedation   FAMILY  - Updates: No family at bedside to update.  - Inter-disciplinary family meet or Palliative Care meeting due by:  8/9   TODAY'S SUMMARY: Patient taken to the operating room for worsening retropharyngeal edema concerning for possible tonsillar abscess. No abscess was aspirated but the patient did have significant tissue swelling and remained intubated postoperatively for stabilization and continued ICU level care. Patient continuing on broad-spectrum antibiotics but switching from clindamycin to Unasyn after discussion with ENT. Also initiating treatment with Solu-Medrol.  I have spent a total of 33 minutes of critical care time today caring for this patient, discussing the plan of care with Dr. Annalee Genta, and reviewing the patient's EMR.   Donna Christen Jamison Neighbor, M.D. [facility] Pager:  [phone] After 3pm or if no response, call [phone]   11/28/2014, 7:23 PM

## 2014-11-28 NOTE — H&P (Signed)
Date: 11/28/2014               Patient Name:  Brooke Dorsey MRN: [id number]  DOB: 05-Jun-1960 Age / Sex: 54 y.o., female   PCP: No Pcp Per Patient         Medical Service: Internal Medicine Teaching Service         Attending Physician: Dr. Doneen Poisson, MD    First Contact: Carolynn Comment, MS-IV Pager: [phone]  Second Contact: Dr. Evelena Peat Pager: [phone]       After Hours (After 5p/  First Contact Pager: [phone]  weekends / holidays): Second Contact Pager: [phone]   Chief Complaint: Neck swelling  History of Present Illness: Ms. Brooke Dorsey is a 54 y.o. female with past medical history of type II DM, insulin dependent who presented to the emergency department with 4 days of worsening symptoms related to left neck swelling, painful swallowing, and sore throat.   She reports that she has not been able to eat or drink at home secondary to pain.  She was seen in an urgent care with similar complaints on 7/30.  She was treated with Rocephin 1g IM, Augmentin suspension, and hydrocodone syrup prn for pain.  At that time she had a negative rapid strep test, group A culture grew normal upper respiratory flora, and CBC showed elevated WBC of 16.1.  Follow-up WBC on 7/31 in the ED was 16.4.  Today on admission WBC 18.9 with 16.0 bands and normal lactic acid.   Patient reports vomiting x 1 yesterday.  At some point she was seen by ENT as well and given PO clindamycin.  She reports that she is unable to take this medication secondary to pain.  Denies any fever, chills, chest pain, shortness of breath, abdominal pain.   Meds: Current Facility-Administered Medications  Medication Dose Route Frequency Provider Last Rate Last Dose  . 0.9 %  sodium chloride infusion   Intravenous Continuous Courtney Paris, MD 150 mL/hr at 11/27/14 2245    . clindamycin (CLEOCIN) IVPB 600 mg  600 mg Intravenous 3 times per day Courtney Paris, MD      . enoxaparin (LOVENOX) injection 40 mg  40 mg Subcutaneous  Q24H Courtney Paris, MD      . insulin aspart (novoLOG) injection 0-5 Units  0-5 Units Subcutaneous QHS Courtney Paris, MD   0 Units at 11/27/14 2314  . insulin aspart (novoLOG) injection 0-9 Units  0-9 Units Subcutaneous TID WC Courtney Paris, MD      . morphine 2 MG/ML injection 2 mg  2 mg Intravenous Q4H PRN Courtney Paris, MD   2 mg at 11/28/14 0301  . ondansetron (ZOFRAN) tablet 4 mg  4 mg Oral Q6H PRN Courtney Paris, MD       Or  . ondansetron [facility]) injection 4 mg  4 mg Intravenous Q6H PRN Courtney Paris, MD      . pneumococcal 23 valent vaccine (PNU-IMMUNE) injection 0.5 mL  0.5 mL Intramuscular Tomorrow-1000 Doneen Poisson, MD        Allergies: Allergies as of 11/27/2014  . (No Known Allergies)   Past Medical History  Diagnosis Date  . Diabetes mellitus without complication    Past Surgical History  Procedure Laterality Date  . Eye surgery      L eye removed   History reviewed. No pertinent family history. History   Social History  . Marital Status: Married    Spouse  Name: N/A  . Number of Children: N/A  . Years of Education: N/A   Occupational History  . Not on file.   Social History Main Topics  . Smoking status: Never Smoker   . Smokeless tobacco: Not on file  . Alcohol Use: No  . Drug Use: No  . Sexual Activity: Not on file   Other Topics Concern  . Not on file   Social History Narrative    Review of Systems  Constitutional: Negative for fever and chills.  HENT: Positive for ear pain and sore throat. Negative for congestion.   Eyes: Negative for blurred vision, photophobia and pain.  Respiratory: Negative for cough, hemoptysis and shortness of breath.   Cardiovascular: Negative for chest pain.  Gastrointestinal: Positive for vomiting. Negative for abdominal pain, diarrhea and constipation.  Musculoskeletal: Positive for neck pain.       Left sided anterior neck pain and swelling.  Neurological: Negative for dizziness and sensory change.    Psychiatric/Behavioral: Negative for substance abuse.     Physical Exam: Blood pressure 125/62, pulse 90, temperature 99.9 F (37.7 C), temperature source Oral, resp. rate 18, height 5\' 1"  (1.549 m), weight 126 lb 6.4 oz (57.335 kg), SpO2 97 %. Physical Exam  Constitutional: She appears well-developed and well-nourished.  HENT:  Head: Normocephalic and atraumatic.  Mouth/Throat: Oropharynx is clear and moist. No oropharyngeal exudate.  She has diffuse tenderness of the left anterior neck.  Does not appear to be tender along her jaw, teeth, or gumline.  There is no rash. No evidence of exudate, thrush, ulcers, or lesions.   Eyes: EOM are normal.  Cardiovascular: Normal rate, regular rhythm, normal heart sounds and intact distal pulses.   Pulmonary/Chest: Effort normal and breath sounds normal. No respiratory distress. She has no wheezes.  Abdominal: Soft. Bowel sounds are normal. There is no tenderness.  Lymphadenopathy:    She has cervical adenopathy (left anterior neck adenopathy with tenderness to palpation.).  Neurological: She is alert. No cranial nerve deficit.  Skin: Skin is warm and dry.    Imaging results:  Ct Soft Tissue Neck W Contrast  11/26/2014   CLINICAL DATA:  Left neck swelling and pain and dysphagia for past 3 days.  EXAM: CT NECK WITH CONTRAST  TECHNIQUE: Multidetector CT imaging of the neck was performed using the standard protocol following the bolus administration of intravenous contrast.  CONTRAST:  75mL OMNIPAQUE IOHEXOL 300 MG/ML  SOLN  COMPARISON:  None.  FINDINGS: Asymmetric swelling and decreased attenuation is seen involving the left palatine tonsil, epiglottis, and retropharyngeal soft tissues. No focal fluid collection seen to suggest abscess formation.  Mild left level 2 jugular lymphadenopathy is seen with largest lymph node measuring up to 12 mm. No evidence of central necrosis. This is likely reactive in etiology. No other masses or lymphadenopathy  identified within the neck.  IMPRESSION: Asymmetric edema involving the left palatine tonsil, epiglottis, and retropharyngeal soft tissues. No discrete abscess identified.  Mild left level 2 jugular lymphadenopathy, likely reactive in etiology.   Electronically Signed   By: Myles Rosenthal M.D.   On: 11/26/2014 13:03    Assessment & Plan by Problem: Active Problems:   Hyperglycemia   Localized swelling, mass or lump of neck  Neck swelling: -4 days of worsening sore throat, left anterior lymphadenopathy, unable to tolerate PO meds.  CT neck from 7/31 shows no discrete abscess and mild left jugular lymphadenopathy, likely reactive in etiology.  Mononucleosis less likely with absence of LFTs,  tonsillar exudates.  Lymphadenopathy is soft, fluctuant, and mobile making malignancy unlikely.  Patient reports no smoking history. -hold PO meds for now -clindamycin  IV q8h -morphine  IV q4h prn for pain -zofran  q6prn for nausea -NS IV fluids 122mL/hr -CBC on admission with WBC of 18.9 with 85% neutrophils and left shift.  Has been trending upwards from 16.1 on 7/30 and 16.4 on 7/31.  Repeat CBC in AM.  Unsure of whether patient has really been able to take PO antibiotics as prescribed secondary to pain.  Now on IV clindamycin. -consider repeat of CT neck, consult ENT if no improvement on clindamycin  Hyperglycemia: -patient reports elevated blood sugars for past few days.  CBG at admission was 274. Down to 198 at 11pm. -SSI tid with meals with HS correction. -Check A1C in AM that is pending. -Na of 132 on admission likely due to elevated glucose.  Continue to monitor with BMP in AM.   Diet: Clear liquids  DVT: Lovenox  daily  Code: Full code  Dispo: Disposition is deferred at this time, awaiting improvement of current medical problems. Anticipated discharge in approximately 1-2 day(s).   The patient does not have a current PCP (No Pcp Per Patient) and does need an [facility] hospital  follow-up appointment after discharge.  The patient does not have transportation limitations that hinder transportation to clinic appointments.  Signed: Gwynn Burly, DO 11/28/2014, 4:20 AM

## 2014-11-28 NOTE — Progress Notes (Signed)
OR transportation at bedside. Report given to primary pre-op RN.

## 2014-11-28 NOTE — Progress Notes (Signed)
Informed by 4N RN that they were transporting a patient to CT scan for soft tissue neck scan who has developed increased trouble with swallowing this afternoon. They report patient alert - no stridor - O2 sats 100% on RA - other VSS.  Dr. Andrey Campanile with patient - i spoke with her - patient has SDU bed - we discussed that if CT findings increased risk of possible intubation to consider ICU bed instead of SDU.  Dr. Andrey Campanile with patient - states she is HD stable for now - will call me as needed.  Follow up on 3S with Becky RN - patient NAD - ENT on way to evaluate - Becky to call as needed.

## 2014-11-28 NOTE — Anesthesia Postprocedure Evaluation (Signed)
  Anesthesia Post-op Note  Patient: Brooke Dorsey  Procedure(s) Performed: Procedure(s): INTUBATION-ENDOTRACHEAL  (N/A) DIRECT LARYNGOSCOPY WITH BIOPSY (N/A)  Patient Location: SICU  Anesthesia Type:General  Level of Consciousness: Patient remains intubated per anesthesia plan  Airway and Oxygen Therapy: Patient remains intubated per anesthesia plan and Patient placed on Ventilator (see vital sign flow sheet for setting)  Post-op Pain: none  Post-op Assessment: Post-op Vital signs reviewed, Patient's Cardiovascular Status Stable, Respiratory Function Stable and Patent Airway              Post-op Vital Signs: Reviewed and stable  Last Vitals:  Filed Vitals:   11/28/14 1852  BP: 141/70  Pulse: 89  Temp: 37.1 C  Resp: 20    Complications: No apparent anesthesia complications

## 2014-11-28 NOTE — Progress Notes (Signed)
Pt's family stated that pt was able to swallow water in the morning but she was now unable to swallow her own secretions. MD paged and stated that they would be coming shortly. Suction set up and encouraged. IV morphine given. Pt stated that pain meds did not help with swallowing. MD arrived and ordered stat CT for pt. Pt taken to CT by Clinical research associate and another Charity fundraiser. RN called Rapid Response RN on the way to CT to inform her of situation. RN informed that pt was to transfer to 3S02 after CT. RN called to give report to 3S. 3S RN was busy and wanted report called back in 10 minutes. RN brought pt directly to 3S after CT to give bedside report. MD was at bedside with pt.

## 2014-11-28 NOTE — Progress Notes (Signed)
Utilization review completed. Karenann Mcgrory, RN, BSN. 

## 2014-11-28 NOTE — Clinical Social Work Note (Signed)
CSW consult acknowledged:  Clinical Child psychotherapist received consult indicating patient needs PCP. Please refer to [facility] for assistance.  Clinical Social Worker will sign off for now as social work intervention is no longer needed. Please consult [place] again if new need arises.  Derenda Fennel, MSW, LCSWA [phone] 11/28/2014 10:00 AM

## 2014-11-28 NOTE — Progress Notes (Signed)
Pt arrived to 3 south, vitals stable. Pt complaining of increased secretions. Suction set up at bedside, education given. Will continue to monitor.

## 2014-11-28 NOTE — Progress Notes (Signed)
Subjective: Brooke Dorsey was seen and examined this morning.  We communicated with her directly in Spanish (member of our team speaks Spanish).  Both patient and her son felt that left submandibular swelling has improved since admission.    Objective: Vital signs in last 24 hours: Filed Vitals:   11/27/14 2257 11/28/14 0246 11/28/14 0522 11/28/14 0951  BP:  125/62 123/52 134/62  Pulse:  90 89 85  Temp:  99.9 F (37.7 C) 99.1 F (37.3 C) 98.1 F (36.7 C)  TempSrc:  Oral Oral Oral  Resp:  18 18 20   Height: 5\' 1"  (1.549 m)     Weight: 126 lb 6.4 oz (57.335 kg)     SpO2:  97% 97% 98%   Weight change:  No intake or output data in the 24 hours ending 11/28/14 1257 General: resting in bed in NAD HEENT: no oropharyngeal exudate, uvula midline, left submandibular gland swollen and TTP Cardiac: RRR, no rubs, murmurs or gallops Pulm: clear to auscultation bilaterally, moving normal volumes of air, no stridor or respiratory distress Abd: soft, nontender, nondistended, BS present Ext: warm and well perfused, no pedal edema Neuro: alert and oriented X3, responding appropriately  Lab Results: Basic Metabolic Panel:  Recent Labs Lab 11/27/14 1951 11/28/14 0555  NA 132* 136  K 3.6 3.2*  CL 101 107  CO2 16* 17*  GLUCOSE 299* 194*  BUN 15 10  CREATININE 0.97 0.60  CALCIUM 9.2 8.4*   CBC:  Recent Labs Lab 11/26/14 0905  11/27/14 1951 11/28/14 0555  WBC 16.4*  --  18.9* 15.7*  NEUTROABS 13.5*  --  16.0*  --   HGB 13.4  < > 12.8 11.6*  HCT 39.2  < > 37.7 34.8*  MCV 83.4  --  84.9 86.1  PLT 223  --  267 243  < > = values in this interval not displayed.  Urinalysis:  Recent Labs Lab 11/28/14 0159  COLORURINE YELLOW  LABSPEC 1.033*  PHURINE 5.5  GLUCOSEU >1000*  HGBUR NEGATIVE  BILIRUBINUR SMALL*  KETONESUR >80*  PROTEINUR 100*  UROBILINOGEN 0.2  NITRITE NEGATIVE  LEUKOCYTESUR NEGATIVE   Micro Results: Recent Results (from the past 240 hour(s))  Culture, Group  A Strep     Status: None   Collection Time: 11/25/14  2:08 PM  Result Value Ref Range Status   Organism ID, Bacteria Normal Upper Respiratory Flora  Final   Organism ID, Bacteria No Beta Hemolytic Streptococci Isolated  Final   Studies/Results: Ct Soft Tissue Neck W Contrast  11/28/2014   CLINICAL DATA:  Neck pain. Trouble swallowing. Patient feels like her throat is closing up.  EXAM: CT NECK WITH CONTRAST  TECHNIQUE: Multidetector CT imaging of the neck was performed using the standard protocol following the bolus administration of intravenous contrast.  CONTRAST:  75mL OMNIPAQUE IOHEXOL 300 MG/ML  SOLN  COMPARISON:  11/26/2014.  FINDINGS: Findings are similar to the recent prior CT. There is heterogeneous, predominantly low attenuation edema/cellulitis that extends from the soft palate superiorly, surrounds the palatine tonsils, left greater than right, and contacts the submandibular glands, greater on the left. The inflammatory changes extend to the false cords and area for got folds and chest to the superior margin of the true vocal cords. There is a more defined retropharyngeal fluid collection, which measures 2 cm x 1.9 cm x 2.1 cm, which has mildly increased in size from the prior study and is better defined. It previously measured approximately 16 mm x 12 mm  x 16 mm. The oral pharyngeal airway is narrowed anterior to this collection, similar to the prior exam. The upper laryngeal airway is also narrowed, similar to the prior study. Hazy inflammatory changes seen in the subcutaneous fat of the submental region.  Superior to the level of the soft palate, the parapharyngeal spaces are unremarkable. The masticator spaces are also unremarkable.  Salivary glands: There is heterogeneous attenuation of the left submandibular gland with more homogeneous attenuation of the right submandibular gland. Left submandibular gland is mildly larger than the right. This may be reactive to the adjacent inflammation. It  could potentially be origin, felt less likely. The parotid glands are unremarkable.  Thyroid: Small ill-defined hypo attenuating lesions are noted consistent with sub cm nodules, stable.  Lymph nodes: Mildly enlarged left sided lymph nodes are noted. There is an 11 mm short axis level 2 jugulodigastric node. A slightly larger node is seen just below this on the left measuring just over 12 mm in short axis. These are stable. No new adenopathy.  Vascular: Unremarkable.  Limited intracranial: Unremarkable.  Visualized orbits: Unremarkable.  Mastoids and visualized paranasal sinuses: Clear.  Skeleton: No significant abnormality. No bone resorption is seen to suggest osteomyelitis.  Upper chest: No upper mediastinal mass or enlarged lymph node. Visualized upper lungs are clear.  IMPRESSION: 1. Slight worsening when compared to the prior CT. 2. There are persistent, extensive inflammatory changes surrounding and involving the oral pharynx and larynx, left greater than right. Inflammatory changes are contiguous with the left submandibular gland, which is also heterogeneous in attenuation and slightly larger than the right submandibular gland. 3. There is a retropharyngeal collection consistent with an abscess, which is better defined and larger than it was previously. Currently it measures 2 x 1.9 x 2.1 cm. No other discrete collection. 4. Oral pharyngeal and upper laryngeal airway is narrowed similar to the prior study.   Electronically Signed   By: Amie Portland M.D.   On: 11/28/2014 16:10   Medications: I have reviewed the patient's current medications. Scheduled Meds: . clindamycin (CLEOCIN) IV  600 mg Intravenous 3 times per day  . enoxaparin (LOVENOX) injection  40 mg Subcutaneous Q24H  . insulin aspart  0-5 Units Subcutaneous QHS  . insulin aspart  0-9 Units Subcutaneous TID WC  . pneumococcal 23 valent vaccine  0.5 mL Intramuscular Tomorrow-1000   Continuous Infusions: . sodium chloride 150 mL/hr at  11/28/14 0544   PRN Meds:.morphine injection, ondansetron **OR** ondansetron (ZOFRAN) IV Assessment/Plan: 54 year old woman with DM here with 6 day hx of left neck pain and swelling.  Retropharyngeal abscess:  She seemed to be improving this AM on IV abx.  She reported difficulty swallowing liquids but she was able to handle secretions and breathing well.  CT neck on 07/31 without evidence of abscess.  Plan was to continue IV clindamycin and monitor for clinical improvement.  I was called by RN this afternoon because patient was having difficulty controlling secretions.  She complained that she felt her throat was closing.  en I re-evaluated her she was suction quite a bit.  She was hemodynamically stable, oxygen sat 100% on room, oropharyngeal exam unchanged from this AM's exam above, no stridor or respiratory distress, left submandibular swelling is unchanged from this AM.  Patient recently got morphine and says she is not having pain.  Stat CT was performed and she was transferred to SDU for closer monitoring.  Stat CT neck concerning for retropharyngeal abscess and ENT consulted  and critical care notified of patient.  Appreciate ENT, Dr. Annalee Genta recommendations and intervention.  He has performed bedside laryngoscopy and reviewed images.  There is a concern that there may be fluid collection or cyst at back of voice box.  He feels she is at risk of airway compromise. - plan for emergent intubation, to OR tonight for direct laryngoscopy with drainage if there is a collection - to ICU for post-op care and monitoring  Dispo: Patient will go to OR for direct laryngoscopy and possible drainage.  ENT plans to transfer to ICU for post-op care.  Plan to resume care on IMTS service once she is stable for transfer from ICU.  The patient does not have a current PCP (No Pcp Per Patient) and does not know need an [facility] hospital follow-up appointment after discharge.  The patient does not know have  transportation limitations that hinder transportation to clinic appointments.  .Services Needed at time of discharge: Y = Yes, Blank = No PT:   OT:   RN:   Equipment:   Other:     LOS: 1 day   Yolanda Manges, DO 11/28/2014, 12:57 PM

## 2014-11-28 NOTE — Transfer of Care (Signed)
Immediate Anesthesia Transfer of Care Note  Patient: Brooke Dorsey  Procedure(s) Performed: Procedure(s): INTUBATION-ENDOTRACHEAL  (N/A) DIRECT LARYNGOSCOPY WITH BIOPSY (N/A)  Patient Location: SICU  Anesthesia Type:General  Level of Consciousness: Patient remains intubated per anesthesia plan  Airway & Oxygen Therapy: Patient remains intubated per anesthesia plan and Patient placed on Ventilator (see vital sign flow sheet for setting)  Post-op Assessment: Report given to RN  Post vital signs: Reviewed and stable  Last Vitals:  Filed Vitals:   11/28/14 1852  BP: 141/70  Pulse: 89  Temp: 37.1 C  Resp: 20    Complications: No apparent anesthesia complications

## 2014-11-28 NOTE — Brief Op Note (Signed)
11/27/2014 - 11/28/2014  6:21 PM  PATIENT:  Brooke Dorsey  54 y.o. female  PRE-OPERATIVE DIAGNOSIS:  Obstructive Airway  POST-OPERATIVE DIAGNOSIS:  Obstructive Airway  PROCEDURE:  Procedure(s): INTUBATION-ENDOTRACHEAL  (N/A) DIRECT LARYNGOSCOPY WITH BIOPSY (N/A)  SURGEON:  Surgeon(s) and Role:    * Osborn Coho, MD - Primary  PHYSICIAN ASSISTANT:   ASSISTANTS: none   ANESTHESIA:   general  EBL:   Min  BLOOD ADMINISTERED:none  DRAINS: none   LOCAL MEDICATIONS USED:  NONE  SPECIMEN:  Source of Specimen:  Left aryteniod   DISPOSITION OF SPECIMEN:  PATHOLOGY  COUNTS:  YES  TOURNIQUET:  * No tourniquets in log *  DICTATION: .Other Dictation: Dictation Number [id number]  PLAN OF CARE: Admit to inpatient   PATIENT DISPOSITION:  PACU - hemodynamically stable.   Delay start of Pharmacological VTE agent (>24hrs) due to surgical blood loss or risk of bleeding: not applicable

## 2014-11-28 NOTE — Anesthesia Preprocedure Evaluation (Signed)
Anesthesia Evaluation  Patient identified by MRN, date of birth, ID band Patient awake    Reviewed: Allergy & Precautions, NPO status , Patient's Chart, lab work & pertinent test results  History of Anesthesia Complications Negative for: history of anesthetic complications  Airway Mallampati: III  TM Distance: >3 FB Neck ROM: Full    Dental  (+) Teeth Intact, Partial Lower,    Pulmonary neg pulmonary ROS,  breath sounds clear to auscultation        Cardiovascular negative cardio ROS  Rhythm:Regular     Neuro/Psych negative neurological ROS     GI/Hepatic negative GI ROS, Neg liver ROS,   Endo/Other  diabetes, Type 2, Insulin Dependent, Oral Hypoglycemic Agents  Renal/GU      Musculoskeletal   Abdominal   Peds  Hematology negative hematology ROS (+)   Anesthesia Other Findings   Reproductive/Obstetrics                             Anesthesia Physical Anesthesia Plan  ASA: III and emergent  Anesthesia Plan: General   Post-op Pain Management:    Induction: Intravenous and Inhalational  Airway Management Planned: Oral ETT and Video Laryngoscope Planned  Additional Equipment: None  Intra-op Plan:   Post-operative Plan: Post-operative intubation/ventilation  Informed Consent: I have reviewed the patients History and Physical, chart, labs and discussed the procedure including the risks, benefits and alternatives for the proposed anesthesia with the patient or authorized representative who has indicated his/her understanding and acceptance.   Dental advisory given  Plan Discussed with: CRNA and Surgeon  Anesthesia Plan Comments:         Anesthesia Quick Evaluation

## 2014-11-29 ENCOUNTER — Encounter (HOSPITAL_COMMUNITY): Payer: Self-pay | Admitting: Otolaryngology

## 2014-11-29 DIAGNOSIS — J9600 Acute respiratory failure, unspecified whether with hypoxia or hypercapnia: Secondary | ICD-10-CM

## 2014-11-29 DIAGNOSIS — J9690 Respiratory failure, unspecified, unspecified whether with hypoxia or hypercapnia: Secondary | ICD-10-CM

## 2014-11-29 DIAGNOSIS — R221 Localized swelling, mass and lump, neck: Secondary | ICD-10-CM

## 2014-11-29 LAB — CBC
HCT: 34.2 % — ABNORMAL LOW (ref 36.0–46.0)
Hemoglobin: 11.3 g/dL — ABNORMAL LOW (ref 12.0–15.0)
MCH: 27.9 pg (ref 26.0–34.0)
MCHC: 33 g/dL (ref 30.0–36.0)
MCV: 84.4 fL (ref 78.0–100.0)
Platelets: 259 10*3/uL (ref 150–400)
RBC: 4.05 MIL/uL (ref 3.87–5.11)
RDW: 13.1 % (ref 11.5–15.5)
WBC: 9.7 10*3/uL (ref 4.0–10.5)

## 2014-11-29 LAB — GLUCOSE, CAPILLARY
GLUCOSE-CAPILLARY: 151 mg/dL — AB (ref 65–99)
GLUCOSE-CAPILLARY: 143 mg/dL — AB (ref 65–99)
GLUCOSE-CAPILLARY: 154 mg/dL — AB (ref 65–99)
GLUCOSE-CAPILLARY: 208 mg/dL — AB (ref 65–99)
Glucose-Capillary: 170 mg/dL — ABNORMAL HIGH (ref 65–99)

## 2014-11-29 LAB — BLOOD GAS, ARTERIAL
ACID-BASE DEFICIT: 6.5 mmol/L — AB (ref 0.0–2.0)
BICARBONATE: 18 meq/L — AB (ref 20.0–24.0)
Drawn by: 418751
FIO2: 0.4
LHR: 16 {breaths}/min
MECHVT: 380 mL
O2 SAT: 98.8 %
PEEP: 5 cmH2O
PO2 ART: 148 mmHg — AB (ref 80.0–100.0)
Patient temperature: 98.6
TCO2: 19 mmol/L (ref 0–100)
pCO2 arterial: 33.3 mmHg — ABNORMAL LOW (ref 35.0–45.0)
pH, Arterial: 7.352 (ref 7.350–7.450)

## 2014-11-29 LAB — BASIC METABOLIC PANEL
Anion gap: 10 (ref 5–15)
BUN: 5 mg/dL — ABNORMAL LOW (ref 6–20)
CALCIUM: 8.7 mg/dL — AB (ref 8.9–10.3)
CO2: 19 mmol/L — ABNORMAL LOW (ref 22–32)
Chloride: 109 mmol/L (ref 101–111)
Creatinine, Ser: 0.72 mg/dL (ref 0.44–1.00)
GFR calc Af Amer: >60 mL/min (ref >60)
GFR calc non Af Amer: >60 mL/min (ref >60)
Glucose, Bld: 169 mg/dL — ABNORMAL HIGH (ref 65–99)
Potassium: 3.6 mmol/L (ref 3.5–5.1)
SODIUM: 138 mmol/L (ref 135–145)

## 2014-11-29 LAB — HEMOGLOBIN A1C
Hgb A1c MFr Bld: 12.1 % — ABNORMAL HIGH (ref 4.8–5.6)
Mean Plasma Glucose: 301 mg/dL

## 2014-11-29 LAB — MAGNESIUM: Magnesium: 1.6 mg/dL — ABNORMAL LOW (ref 1.7–2.4)

## 2014-11-29 LAB — PROCALCITONIN

## 2014-11-29 LAB — PHOSPHORUS: Phosphorus: 1.4 mg/dL — ABNORMAL LOW (ref 2.5–4.6)

## 2014-11-29 MED ORDER — INSULIN ASPART 100 UNIT/ML ~~LOC~~ SOLN
0.0000 [IU] | SUBCUTANEOUS | Status: DC
  Administered 2014-11-29: 4 [IU] via SUBCUTANEOUS
  Administered 2014-11-29: 3 [IU] via SUBCUTANEOUS
  Administered 2014-11-29 (×3): 4 [IU] via SUBCUTANEOUS
  Administered 2014-11-29: 7 [IU] via SUBCUTANEOUS
  Administered 2014-11-30: 4 [IU] via SUBCUTANEOUS
  Administered 2014-11-30 (×4): 7 [IU] via SUBCUTANEOUS
  Administered 2014-12-01: 11 [IU] via SUBCUTANEOUS
  Administered 2014-12-01: 7 [IU] via SUBCUTANEOUS
  Administered 2014-12-01: 11 [IU] via SUBCUTANEOUS
  Administered 2014-12-01: 4 [IU] via SUBCUTANEOUS
  Administered 2014-12-01 – 2014-12-02 (×3): 11 [IU] via SUBCUTANEOUS
  Administered 2014-12-02: 7 [IU] via SUBCUTANEOUS
  Administered 2014-12-02 (×2): 3 [IU] via SUBCUTANEOUS
  Administered 2014-12-02: 7 [IU] via SUBCUTANEOUS
  Administered 2014-12-02: 11 [IU] via SUBCUTANEOUS
  Administered 2014-12-03: 7 [IU] via SUBCUTANEOUS
  Administered 2014-12-03: 11 [IU] via SUBCUTANEOUS
  Administered 2014-12-03: 4 [IU] via SUBCUTANEOUS
  Administered 2014-12-03: 11 [IU] via SUBCUTANEOUS

## 2014-11-29 NOTE — Anesthesia Postprocedure Evaluation (Signed)
  Anesthesia Post-op Note  Patient: Brooke Dorsey  Procedure(s) Performed: Procedure(s): INTUBATION-ENDOTRACHEAL  (N/A) DIRECT LARYNGOSCOPY WITH BIOPSY (N/A)  Patient Location: ICU  Anesthesia Type:General  Level of Consciousness: sedated  Airway and Oxygen Therapy: Patient remains intubated per anesthesia plan  Post-op Pain: none  Post-op Assessment: Post-op Vital signs reviewed, Patient's Cardiovascular Status Stable, Respiratory Function Stable, Patent Airway and Pain level controlled              Post-op Vital Signs: Reviewed and stable  Last Vitals:  Filed Vitals:   11/29/14 1500  BP: 128/71  Pulse: 72  Temp:   Resp: 0    Complications: No apparent anesthesia complications

## 2014-11-29 NOTE — Plan of Care (Signed)
Problem: Phase I Progression Outcomes Goal: Pain controlled with appropriate interventions Outcome: Progressing PRN Fentanyl Goal: OOB as tolerated unless otherwise ordered Outcome: Not Progressing Pt remains vented

## 2014-11-29 NOTE — Op Note (Signed)
NAMETELESHA, Brooke Dorsey               ACCOUNT NO.:  [id number]  MEDICAL RECORD NO.:  [id number]  LOCATION:  2S02C                        FACILITY:  [facility]  PHYSICIAN:  Kinnie Scales. Annalee Genta, M.D.DATE OF BIRTH:  05/29/1960  DATE OF PROCEDURE:  11/28/2014 DATE OF DISCHARGE:                              OPERATIVE REPORT   LOCATION:  [facility] Main OR.  PREOPERATIVE DIAGNOSIS:  Severe dysphagia with progressive airway symptoms.  POSTOPERATIVE DIAGNOSIS:  Severe dysphagia with progressive airway symptoms.  INDICATION FOR SURGERY:  Severe dysphagia with progressive airway symptoms.  SURGICAL PROCEDURES: 1. Intubation by Anesthesia. 2. Direct laryngoscopy and biopsy.  ANESTHESIA:  General endotracheal.  COMPLICATIONS:  None.  BLOOD LOSS:  Minimal.  DISPOSITION:  The patient was transferred from the operating room to the intensive care unit in stable condition.  BRIEF HISTORY:  The patient is a 54 year old Hispanic female, who was admitted to [facility] with progressive symptoms of dysphagia. Initial findings were consistent with infection with elevated white blood cell count and fever.  The patient was treated with intravenous antibiotics and IV hydration with some initial improvement.  Over the several hours prior to surgery, she developed increasing symptoms of dysphagia.  An emergency CT scan was obtained, which was compared to previous studies and showed increasing swelling and what appeared to be the retropharynx.  The patient was evaluated at the bed side.  There was no stridor and flexible laryngoscopy showed significant posterior arytenoid edema and swelling with possible compromise of the airway. Given the patient's history and findings, I recommended emergency intubation and possible tracheostomy with followup direct laryngoscopy and possible incision and drainage versus biopsy.  The risks and benefits were discussed in detail with her family and the  patient was taken to the operating room directly for the above surgical procedure.  DESCRIPTION OF PROCEDURE:  The patient was brought to the Operating Room of [facility] Main OR, and placed in supine position on the operating table.  General endotracheal anesthesia was established without difficulty by the Anesthesia Service.  Under indirect visualization using the GlideScope, the patient's airway was stable with an endotracheal tube placed.  Once the patient has been stable, she was prepped and draped and laryngoscopy was performed.  Using the Dedo laryngoscope, the patient's airway was examined.  The supraglottis showed a moderate amount of edema and swelling along the palate and lateral oropharynx.  The patient had soft tissue edema involving the aryepiglottic folds bilaterally with significant edema of the arytenoids.  The airway was otherwise normal and there was no evidence of retropharyngeal abscess or fluid collection.  A cup forceps was then used to perform a mucosal biopsy in the region of the swelling of the left arytenoid.  There was no evidence of pus or fluid collection despite the significant swelling and findings on CT scan.  This was consistent with significant soft tissue edema as opposed to actual fluid collection.  The patient tolerated the procedure without complication or difficulty.  She remained intubated and was then transferred from the operating room to the intensive care unit in stable condition for further postoperative care.  ______________________________ Kinnie Scales Annalee Genta, M.D.     DLS/MEDQ  D:  16/01/9603  T:  11/29/2014  Job:  [id number]

## 2014-11-29 NOTE — Progress Notes (Addendum)
PULMONARY / CRITICAL CARE MEDICINE   Name: Brooke Dorsey MRN: [id number] DOB: 08-26-1960    ADMISSION DATE:  11/27/2014 CONSULTATION DATE:  11/28/14  REFERRING MD : Dr. Annalee Genta  CHIEF COMPLAINT: Recurrent oropharyngeal/tonsillar cellulitis  INITIAL PRESENTATION: 54 year old female with known history of diabetes presented with a four-day history of worsening left neck pain & sore throat. Patient given IM Rocephin on July 30 but per records unable to tolerate oral antibiotics.  STUDIES:  CT Neck/Soft Tissue (11/28/14): Slight worsening with extensive inflammatory changes surrounding the oropharynx and larynx left greater than right. Inflammatory changes contiguous left submandibular gland. Suggestion of retropharyngeal collection consistent with abscess.  SIGNIFICANT EVENTS: 8/2 - Patient taken to the operating room for worsening retropharyngeal edema concerning for possible tonsillar abscess. No abscess was aspirated but the patient did have significant tissue swelling and remained intubated postoperatively for stabilization and continued ICU level care. Patient continuing on broad-spectrum antibiotics but switching from clindamycin to Unasyn after discussion with ENT. Also initiating treatment with Solu-Medrol.  HISTORY OF PRESENT ILLNESS: Patient had a four-day history of sore throat with a negative rapid strep screen on July 30. No H/O fevers, chills, cough or sputum production. She was unable to tolerate oral antibiotics and had no stridor or dyspnea on presentation. Patient began to have difficulty managing her oral secretions by the afternoon and a repeat CT scan was performed of her soft tissues. There was concern for a retropharyngeal abscess and patient was electively intubated and taken to the operating room by Dr. Annalee Genta. No such abscess was found. Tonsillar biopsy was performed. The patient was transitioned to the intensive care unit for further management and continued  endotracheal agitation daily protection. Admission the patient's antibiotics were switched to clindamycin and then to unasyn.   Prior to Admission medications   Medication Sig Start Date End Date Taking? Authorizing Provider  amoxicillin-clavulanate (AUGMENTIN) 400-57 MG/5ML suspension 9ml po BID for 10 days.   Label in spanish Patient taking differently: See admin instructions. 9ml po BID for 10 days.   Label in spanish 11/25/14  Yes Shade Flood, MD  clindamycin (CLEOCIN) 300 MG capsule Take 300 mg by mouth 4 (four) times daily. Take for 10 days started on 11-27-14   Yes Historical Provider, MD  HYDROcodone-acetaminophen (HYCET) 7.5-325 mg/15 ml solution Take 10-15 mLs by mouth every 6 (six) hours as needed for moderate pain. Label in spanish Patient taking differently: Take 10-45 mLs by mouth every 6 (six) hours as needed for moderate pain. Label in spanish 11/25/14 11/25/15 Yes Shade Flood, MD  insulin lispro protamine-lispro (HUMALOG 75/25 MIX) (75-25) 100 UNIT/ML SUSP injection Inject 10 Units into the skin every morning.    Yes Historical Provider, MD  ondansetron (ZOFRAN-ODT) 4 MG disintegrating tablet Take 4 mg by mouth every 4 (four) hours as needed for nausea or vomiting.   Yes Historical Provider, MD  ondansetron (ZOFRAN ODT) 4 MG disintegrating tablet 4mg  ODT q4 hours prn nausea/vomit Patient not taking: Reported on 11/27/2014 11/26/14   Pricilla Loveless, MD   No Known Allergies  FAMILY HISTORY:  has no family status information on file.  SOCIAL HISTORY:  reports that she has never smoked. She does not have any smokeless tobacco history on file. She reports that she does not drink alcohol or use illicit drugs.  REVIEW OF SYSTEMS:  Unable to obtain as the patient is intubated and sedated.  SUBJECTIVE:   VITAL SIGNS: Temp:  [98.2 F (36.8 C)-99.7 F (37.6 C)] 99.7  F (37.6 C) (08/03 0849) Pulse Rate:  [69-104] 96 (08/03 0900) Resp:  [8-21] 17 (08/03 0900) BP:  (97-151)/(50-70) 117/63 mmHg (08/03 0900) SpO2:  [96 %-100 %] 100 % (08/03 0900) FiO2 (%):  [40 %-100 %] 40 % (08/03 0715) Weight:  [126 lb 8.7 oz (57.4 kg)-132 lb 7.9 oz (60.1 kg)] 132 lb 7.9 oz (60.1 kg) (08/03 0600) HEMODYNAMICS:   VENTILATOR SETTINGS: Vent Mode:  [-] PRVC FiO2 (%):  [40 %-100 %] 40 % Set Rate:  [16 bmp] 16 bmp Vt Set:  [380 mL] 380 mL PEEP:  [5 cmH20] 5 cmH20 Plateau Pressure:  [11 cmH20-14 cmH20] 11 cmH20 INTAKE / OUTPUT:  Intake/Output Summary (Last 24 hours) at 11/29/14 1000 Last data filed at 11/29/14 0900  Gross per 24 hour  Intake   1637 ml  Output   1450 ml  Net    187 ml    PHYSICAL EXAMINATION: General:  Sedated, arousable Neuro:  No gross focal deficits   HEENT:  PERRLA, moist mucus membranes  Cardiovascular:  RRR. No murmurs, rubs or gallops Lungs:  Clear Abdomen:  Soft, + BS Skin: Intact  LABS:  CBC  Recent Labs Lab 11/27/14 1951 11/28/14 0555 11/29/14 0310  WBC 18.9* 15.7* 9.7  HGB 12.8 11.6* 11.3*  HCT 37.7 34.8* 34.2*  PLT 267 243 259   Coag's No results for input(s): APTT, INR in the last 168 hours. BMET  Recent Labs Lab 11/27/14 1951 11/28/14 0555 11/29/14 0310  NA 132* 136 138  K 3.6 3.2* 3.6  CL 101 107 109  CO2 16* 17* 19*  BUN 15 10 5*  CREATININE 0.97 0.60 0.72  GLUCOSE 299* 194* 169*   Electrolytes  Recent Labs Lab 11/27/14 1951 11/28/14 0555 11/29/14 0310  CALCIUM 9.2 8.4* 8.7*  MG  --   --  1.6*  PHOS  --   --  1.4*   Sepsis Markers  Recent Labs Lab 11/27/14 2210 11/28/14 2021 11/29/14 0310  LATICACIDVEN 1.1  --   --   PROCALCITON  --  <0.10 <0.10   ABG  Recent Labs Lab 11/28/14 2025 11/29/14 0325  PHART 7.327* 7.352  PCO2ART 32.6* 33.3*  PO2ART 480* 148*   Liver Enzymes  Recent Labs Lab 11/26/14 0905 11/27/14 1951  AST 17 15  ALT 17 14  ALKPHOS 102 102  BILITOT 1.4* 1.2  ALBUMIN 3.4* 3.5   Cardiac Enzymes No results for input(s): TROPONINI, PROBNP in the last 168  hours. Glucose  Recent Labs Lab 11/27/14 2300 11/28/14 0659 11/28/14 1108 11/28/14 2349 11/29/14 0412 11/29/14 0847  GLUCAP 198* 190* 165* 171* 151* 170*    Imaging Ct Soft Tissue Neck W Contrast  11/28/2014   CLINICAL DATA:  Neck pain. Trouble swallowing. Patient feels like her throat is closing up.  EXAM: CT NECK WITH CONTRAST  TECHNIQUE: Multidetector CT imaging of the neck was performed using the standard protocol following the bolus administration of intravenous contrast.  CONTRAST:  75mL OMNIPAQUE IOHEXOL 300 MG/ML  SOLN  COMPARISON:  11/26/2014.  FINDINGS: Findings are similar to the recent prior CT. There is heterogeneous, predominantly low attenuation edema/cellulitis that extends from the soft palate superiorly, surrounds the palatine tonsils, left greater than right, and contacts the submandibular glands, greater on the left. The inflammatory changes extend to the false cords and area for got folds and chest to the superior margin of the true vocal cords. There is a more defined retropharyngeal fluid collection, which measures 2 cm x 1.9  cm x 2.1 cm, which has mildly increased in size from the prior study and is better defined. It previously measured approximately 16 mm x 12 mm x 16 mm. The oral pharyngeal airway is narrowed anterior to this collection, similar to the prior exam. The upper laryngeal airway is also narrowed, similar to the prior study. Hazy inflammatory changes seen in the subcutaneous fat of the submental region.  Superior to the level of the soft palate, the parapharyngeal spaces are unremarkable. The masticator spaces are also unremarkable.  Salivary glands: There is heterogeneous attenuation of the left submandibular gland with more homogeneous attenuation of the right submandibular gland. Left submandibular gland is mildly larger than the right. This may be reactive to the adjacent inflammation. It could potentially be origin, felt less likely. The parotid glands are  unremarkable.  Thyroid: Small ill-defined hypo attenuating lesions are noted consistent with sub cm nodules, stable.  Lymph nodes: Mildly enlarged left sided lymph nodes are noted. There is an 11 mm short axis level 2 jugulodigastric node. A slightly larger node is seen just below this on the left measuring just over 12 mm in short axis. These are stable. No new adenopathy.  Vascular: Unremarkable.  Limited intracranial: Unremarkable.  Visualized orbits: Unremarkable.  Mastoids and visualized paranasal sinuses: Clear.  Skeleton: No significant abnormality. No bone resorption is seen to suggest osteomyelitis.  Upper chest: No upper mediastinal mass or enlarged lymph node. Visualized upper lungs are clear.  IMPRESSION: 1. Slight worsening when compared to the prior CT. 2. There are persistent, extensive inflammatory changes surrounding and involving the oral pharynx and larynx, left greater than right. Inflammatory changes are contiguous with the left submandibular gland, which is also heterogeneous in attenuation and slightly larger than the right submandibular gland. 3. There is a retropharyngeal collection consistent with an abscess, which is better defined and larger than it was previously. Currently it measures 2 x 1.9 x 2.1 cm. No other discrete collection. 4. Oral pharyngeal and upper laryngeal airway is narrowed similar to the prior study.   Electronically Signed   By: Amie Portland M.D.   On: 11/28/2014 16:10   Dg Chest Port 1 View  11/28/2014   CLINICAL DATA:  Respiratory failure  EXAM: PORTABLE CHEST - 1 VIEW  COMPARISON:  None.  FINDINGS: Endotracheal tube tip projects 2.8 cm above the carina.  Nasogastric tube passes below the diaphragm to curl well within the stomach.  Lungs are clear.  No pleural effusion or pneumothorax.  Cardiac silhouette is normal in size. No mediastinal or hilar masses or evidence of adenopathy.  IMPRESSION: 1. No acute cardiopulmonary disease. 2. Endotracheal tube and  nasogastric tube are well positioned.   Electronically Signed   By: Amie Portland M.D.   On: 11/28/2014 20:40     ASSESSMENT / PLAN:  PULMONARY OETT 7.0 8/2 >> A: Intubation for airway protection/preoperative  P:  Vent bundle Daily SBT Albuterol scheduled Solu-Medrol IV- Started on 8/2. Will give atleast 2 day of steroids/antibiotics before considering extubation given the significant airway edema. Will co-ordinate airway assessment with ENT.  CARDIOVASCULAR A:  Hypotension postoperative  P:  Off neosynephrine now, Monitor on telemetry  RENAL A:  Hypokalemia - Replacing IV.  P:  Place foley catheter Monitor UOP Daily BUN, Creatinine, & Electrolytes  GASTROINTESTINAL A:  No acute issues  P:  NPO Protonix IV for prophylaxis  HEMATOLOGIC A:  Anemia - no signs of active bleeding  P:  Monitor hemoglobin daily with CBC  Trending Leukocytosis- Improving. Lovenox & SCD for DVT prophylaxis  INFECTIOUS A:  Retropharyngeal/tonsillar cellulitis  P:  Group A Strep (7/30): Negative  Abx:  Clindamycin 8/1 >> 8/2 Unasyn 8/2 >>  ENDOCRINE A:  DM-II   P:  Hemoglobin A1c pending Sliding-scale insulin with Accu-Cheks every 4 hours  NEUROLOGIC A:  No acute issues  P:  RASS goal: 0 to -1 Continue propofol for sedation   FAMILY  - Updates: Son updated at bedside. - Inter-disciplinary family meet or Palliative Care meeting due by: 8/9    Chilton Greathouse MD Pulmonary and Critical Care Medicine [facility] Pager: [phone]  11/29/2014, 10:00 AM

## 2014-11-29 NOTE — Progress Notes (Signed)
ENT Progress Note: POD #1 s/p Procedure(s): INTUBATION-ENDOTRACHEAL  DIRECT LARYNGOSCOPY WITH BIOPSY   Subjective: Stable and intubated  Objective: Vital signs in last 24 hours: Temp:  [98.2 F (36.8 C)-99.7 F (37.6 C)] 99.7 F (37.6 C) (08/03 0849) Pulse Rate:  [69-104] 80 (08/03 1000) Resp:  [8-21] 12 (08/03 1000) BP: (97-151)/(50-70) 117/58 mmHg (08/03 1000) SpO2:  [96 %-100 %] 100 % (08/03 1000) FiO2 (%):  [40 %-100 %] 40 % (08/03 0715) Weight:  [57.4 kg (126 lb 8.7 oz)-60.1 kg (132 lb 7.9 oz)] 60.1 kg (132 lb 7.9 oz) (08/03 0600) Weight change: 0.065 kg (2.3 oz) Last BM Date: 11/22/14  Intake/Output from previous day: 08/02 0701 - 08/03 0700 In: 1532 [I.V.:1432; IV Piggyback:100] Out: 1275 [Urine:1275] Intake/Output this shift: Total I/O In: 105 [I.V.:105] Out: 150 [Urine:150]  Labs:  Recent Labs  11/28/14 0555 11/29/14 0310  WBC 15.7* 9.7  HGB 11.6* 11.3*  HCT 34.8* 34.2*  PLT 243 259    Recent Labs  11/28/14 0555 11/29/14 0310  NA 136 138  K 3.2* 3.6  CL 107 109  CO2 17* 19*  GLUCOSE 194* 169*  BUN 10 5*  CALCIUM 8.4* 8.7*    Studies/Results: Ct Soft Tissue Neck W Contrast  11/28/2014   CLINICAL DATA:  Neck pain. Trouble swallowing. Patient feels like her throat is closing up.  EXAM: CT NECK WITH CONTRAST  TECHNIQUE: Multidetector CT imaging of the neck was performed using the standard protocol following the bolus administration of intravenous contrast.  CONTRAST:  75mL OMNIPAQUE IOHEXOL 300 MG/ML  SOLN  COMPARISON:  11/26/2014.  FINDINGS: Findings are similar to the recent prior CT. There is heterogeneous, predominantly low attenuation edema/cellulitis that extends from the soft palate superiorly, surrounds the palatine tonsils, left greater than right, and contacts the submandibular glands, greater on the left. The inflammatory changes extend to the false cords and area for got folds and chest to the superior margin of the true vocal cords.  There is a more defined retropharyngeal fluid collection, which measures 2 cm x 1.9 cm x 2.1 cm, which has mildly increased in size from the prior study and is better defined. It previously measured approximately 16 mm x 12 mm x 16 mm. The oral pharyngeal airway is narrowed anterior to this collection, similar to the prior exam. The upper laryngeal airway is also narrowed, similar to the prior study. Hazy inflammatory changes seen in the subcutaneous fat of the submental region.  Superior to the level of the soft palate, the parapharyngeal spaces are unremarkable. The masticator spaces are also unremarkable.  Salivary glands: There is heterogeneous attenuation of the left submandibular gland with more homogeneous attenuation of the right submandibular gland. Left submandibular gland is mildly larger than the right. This may be reactive to the adjacent inflammation. It could potentially be origin, felt less likely. The parotid glands are unremarkable.  Thyroid: Small ill-defined hypo attenuating lesions are noted consistent with sub cm nodules, stable.  Lymph nodes: Mildly enlarged left sided lymph nodes are noted. There is an 11 mm short axis level 2 jugulodigastric node. A slightly larger node is seen just below this on the left measuring just over 12 mm in short axis. These are stable. No new adenopathy.  Vascular: Unremarkable.  Limited intracranial: Unremarkable.  Visualized orbits: Unremarkable.  Mastoids and visualized paranasal sinuses: Clear.  Skeleton: No significant abnormality. No bone resorption is seen to suggest osteomyelitis.  Upper chest: No upper mediastinal mass or enlarged lymph node.  Visualized upper lungs are clear.  IMPRESSION: 1. Slight worsening when compared to the prior CT. 2. There are persistent, extensive inflammatory changes surrounding and involving the oral pharynx and larynx, left greater than right. Inflammatory changes are contiguous with the left submandibular gland, which is also  heterogeneous in attenuation and slightly larger than the right submandibular gland. 3. There is a retropharyngeal collection consistent with an abscess, which is better defined and larger than it was previously. Currently it measures 2 x 1.9 x 2.1 cm. No other discrete collection. 4. Oral pharyngeal and upper laryngeal airway is narrowed similar to the prior study.   Electronically Signed   By: Amie Portland M.D.   On: 11/28/2014 16:10   Dg Chest Port 1 View  11/28/2014   CLINICAL DATA:  Respiratory failure  EXAM: PORTABLE CHEST - 1 VIEW  COMPARISON:  None.  FINDINGS: Endotracheal tube tip projects 2.8 cm above the carina.  Nasogastric tube passes below the diaphragm to curl well within the stomach.  Lungs are clear.  No pleural effusion or pneumothorax.  Cardiac silhouette is normal in size. No mediastinal or hilar masses or evidence of adenopathy.  IMPRESSION: 1. No acute cardiopulmonary disease. 2. Endotracheal tube and nasogastric tube are well positioned.   Electronically Signed   By: Amie Portland M.D.   On: 11/28/2014 20:40     PHYSICAL EXAM: OT tube inplace Airway is stable No LA or swelling   Assessment/Plan: Clinical improvement, nl WBC, cont fever Cont current tx per CCM Rec intubation and airway support for several days to allow resolution of infection and swelling  Plan repeat CT on Friday am and scope, if sig improvement would consider extubation.    Brooke Dorsey 11/29/2014, 10:58 AM

## 2014-11-30 DIAGNOSIS — J9601 Acute respiratory failure with hypoxia: Secondary | ICD-10-CM

## 2014-11-30 LAB — GLUCOSE, CAPILLARY
GLUCOSE-CAPILLARY: 223 mg/dL — AB (ref 65–99)
GLUCOSE-CAPILLARY: 248 mg/dL — AB (ref 65–99)
Glucose-Capillary: 119 mg/dL — ABNORMAL HIGH (ref 65–99)
Glucose-Capillary: 155 mg/dL — ABNORMAL HIGH (ref 65–99)
Glucose-Capillary: 212 mg/dL — ABNORMAL HIGH (ref 65–99)
Glucose-Capillary: 234 mg/dL — ABNORMAL HIGH (ref 65–99)
Glucose-Capillary: 292 mg/dL — ABNORMAL HIGH (ref 65–99)

## 2014-11-30 LAB — PROCALCITONIN

## 2014-11-30 MED ORDER — POTASSIUM PHOSPHATES 15 MMOLE/5ML IV SOLN
30.0000 mmol | Freq: Once | INTRAVENOUS | Status: AC
  Administered 2014-11-30: 30 mmol via INTRAVENOUS
  Filled 2014-11-30: qty 10

## 2014-11-30 MED ORDER — VITAL HIGH PROTEIN PO LIQD
1000.0000 mL | ORAL | Status: DC
  Filled 2014-11-30 (×3): qty 1000

## 2014-11-30 MED ORDER — PRO-STAT SUGAR FREE PO LIQD
30.0000 mL | Freq: Two times a day (BID) | ORAL | Status: DC
  Administered 2014-11-30 – 2014-12-01 (×3): 30 mL
  Filled 2014-11-30 (×4): qty 30

## 2014-11-30 MED ORDER — VITAL HIGH PROTEIN PO LIQD
1000.0000 mL | ORAL | Status: DC
  Filled 2014-11-30: qty 1000

## 2014-11-30 MED ORDER — VITAL HIGH PROTEIN PO LIQD
1000.0000 mL | ORAL | Status: DC
  Administered 2014-11-30: 23:00:00
  Administered 2014-11-30: 1000 mL
  Administered 2014-11-30 – 2014-12-01 (×8)
  Filled 2014-11-30 (×3): qty 1000

## 2014-11-30 NOTE — Progress Notes (Addendum)
Initial Nutrition Assessment  DOCUMENTATION CODES:   Not applicable  INTERVENTION:   Initiate Vital High Protein at 20 ml/hr and in 4 hours increase by 10 ml to goal rate of 30 ml/hr   Prostat liquid protein 30 ml BID  TF regimen + current Propofol infusion to provide 1374 kcals, 93 gm protein, 602 ml of free water  NUTRITION DIAGNOSIS:   Inadequate oral intake related to inability to eat as evidenced by NPO status  GOAL:   Patient will meet greater than or equal to 90% of their needs  MONITOR:   Vent status, TF tolerance, Labs, Weight trends, I & O's  REASON FOR ASSESSMENT:   Consult Enteral/tube feeding initiation and management  ASSESSMENT:   54 y.o. Female with known history of diabetes presented with a four-day history of worsening left neck pain & sore throat. Patient given IM Rocephin on July 30 but per records unable to tolerate oral antibiotics.  Patient taken to OR for worsening retropharyngeal edema concerning for possible tonsillar abscess. No abscess was aspirated but the patient did have significant tissue swelling.  Patient is currently intubated on ventilator support -- OGT in place MV: 6.2 L/min Temp (24hrs), Avg:98.6 F (37 C), Min:96.7 F (35.9 C), Max:100 F (37.8 C)   Propofol: 17.2 ml/hr ----> 454 fat kcals  Vital High Protein formula ordered per Adult Tube Feeding Protocol this AM per CCM.  Goal rate is 40 ml/hr.  RD to adjust orders.  RD unable to complete Nutrition Focused Physical Exam at this time.  Diet Order:  Diet NPO time specified  Skin:  Reviewed, no issues  Last BM:  7/27  Height:   Ht Readings from Last 1 Encounters:  11/28/14  (1.549 m)    Weight:   Wt Readings from Last 1 Encounters:  11/29/14 132 lb 7.9 oz (60.1 kg)    Ideal Body Weight:  47.7 kg  BMI:  Body mass index is 25.05 kg/(m^2).  Estimated Nutritional Needs:   Kcal:  1377  Protein:  90-100 gm  Fluid:  per MD  EDUCATION NEEDS:   No  education needs identified at this time  Maureen Chatters, RD, LDN Pager #: [phone] After-Hours Pager #: [phone]

## 2014-11-30 NOTE — Progress Notes (Signed)
PULMONARY / CRITICAL CARE MEDICINE   Name: Brooke Dorsey MRN: [id number] DOB: 12-Aug-1960    ADMISSION DATE:  11/27/2014 CONSULTATION DATE:  11/28/14  REFERRING MD : Dr. Annalee Genta  CHIEF COMPLAINT: Recurrent oropharyngeal/tonsillar cellulitis  INITIAL PRESENTATION: 54 year old female with known history of diabetes presented with a four-day history of worsening left neck pain & sore throat. Patient given IM Rocephin on July 30 but per records unable to tolerate oral antibiotics.  STUDIES:  CT Neck/Soft Tissue (11/28/14): Slight worsening with extensive inflammatory changes surrounding the oropharynx and larynx left greater than right. Inflammatory changes contiguous left submandibular gland. Suggestion of retropharyngeal collection consistent with abscess.  SIGNIFICANT EVENTS: 8/2 - Patient taken to the operating room for worsening retropharyngeal edema concerning for possible tonsillar abscess. No abscess was aspirated but the patient did have significant tissue swelling and remained intubated postoperatively for stabilization and continued ICU level care. Patient continuing on broad-spectrum antibiotics but switching from clindamycin to Unasyn after discussion with ENT. Also initiating treatment with Solu-Medrol.    SUBJECTIVE: spanish speaking only Denies pain Agitated on WUA afebrile  VITAL SIGNS: Temp:  [96.7 F (35.9 C)-100 F (37.8 C)] 96.7 F (35.9 C) (08/04 0733) Pulse Rate:  [59-111] 59 (08/04 0600) Resp:  [0-31] 16 (08/04 0600) BP: (112-148)/(55-84) 133/66 mmHg (08/04 0600) SpO2:  [99 %-100 %] 100 % (08/04 0600) FiO2 (%):  [40 %] 40 % (08/04 0403) HEMODYNAMICS:   VENTILATOR SETTINGS: Vent Mode:  [-] PRVC FiO2 (%):  [40 %] 40 % Set Rate:  [16 bmp] 16 bmp Vt Set:  [380 mL] 380 mL PEEP:  [5 cmH20] 5 cmH20 Plateau Pressure:  [10 cmH20] 10 cmH20 INTAKE / OUTPUT:  Intake/Output Summary (Last 24 hours) at 11/30/14 0737 Last data filed at 11/30/14 0600  Gross per  24 hour  Intake 1065.44 ml  Output   1140 ml  Net -74.56 ml    PHYSICAL EXAMINATION: General:  Sedated, arousable, RASS 0 to +1 Neuro:  No gross focal deficits, follows commands   HEENT:  PERRLA, moist mucus membranes  Cardiovascular:  RRR. No murmurs, rubs or gallops Lungs:  Clear Abdomen:  Soft, + BS Skin: Intact  LABS:  CBC  Recent Labs Lab 11/27/14 1951 11/28/14 0555 11/29/14 0310  WBC 18.9* 15.7* 9.7  HGB 12.8 11.6* 11.3*  HCT 37.7 34.8* 34.2*  PLT 267 243 259   Coag's No results for input(s): APTT, INR in the last 168 hours. BMET  Recent Labs Lab 11/27/14 1951 11/28/14 0555 11/29/14 0310  NA 132* 136 138  K 3.6 3.2* 3.6  CL 101 107 109  CO2 16* 17* 19*  BUN 15 10 5*  CREATININE 0.97 0.60 0.72  GLUCOSE 299* 194* 169*   Electrolytes  Recent Labs Lab 11/27/14 1951 11/28/14 0555 11/29/14 0310  CALCIUM 9.2 8.4* 8.7*  MG  --   --  1.6*  PHOS  --   --  1.4*   Sepsis Markers  Recent Labs Lab 11/27/14 2210 11/28/14 2021 11/29/14 0310 11/30/14 0534  LATICACIDVEN 1.1  --   --   --   PROCALCITON  --  <0.10 <0.10 <0.10   ABG  Recent Labs Lab 11/28/14 2025 11/29/14 0325  PHART 7.327* 7.352  PCO2ART 32.6* 33.3*  PO2ART 480* 148*   Liver Enzymes  Recent Labs Lab 11/26/14 0905 11/27/14 1951  AST 17 15  ALT 17 14  ALKPHOS 102 102  BILITOT 1.4* 1.2  ALBUMIN 3.4* 3.5   Cardiac Enzymes No results for  input(s): TROPONINI, PROBNP in the last 168 hours. Glucose  Recent Labs Lab 11/29/14 0847 11/29/14 1221 11/29/14 1642 11/29/14 1931 11/30/14 0002 11/30/14 0404  GLUCAP 170* 143* 154* 208* 223* 155*    Imaging No results found.   ASSESSMENT / PLAN:  PULMONARY OETT 7.0 8/2 >> A: Intubation for airway protection/preoperative  P:  Vent bundle Daily SBT Albuterol scheduled Solu-Medrol IV- Started on 8/2.ct  Steroids/antibiotics-repeat assess on Friday for extubation given the significant airway edema. Will co-ordinate  airway assessment with ENT.  CARDIOVASCULAR A:  Hypotension postoperative  P:  Monitor on telemetry  RENAL A:  Hypokalemia - Replacing IV. hypophos  P:  Place foley catheter Monitor UOP   GASTROINTESTINAL A:  No acute issues  P:  NPO Protonix IV for prophylaxis Start TFs  HEMATOLOGIC A:  Anemia - no signs of active bleeding  P:  Monitor hemoglobin daily with CBC Trending Leukocytosis- Improving. Lovenox & SCD for DVT prophylaxis  INFECTIOUS A:  Retropharyngeal/tonsillar cellulitis  P:  Group A Strep (7/30): Negative  Abx:  Clindamycin 8/1 >> 8/2 Unasyn 8/2 >>  ENDOCRINE A:  DM-II   P:  Hemoglobin A1c pending Sliding-scale insulin with Accu-Cheks every 4 hours  NEUROLOGIC A:  No acute issues  P:  RASS goal: 0 to -1 Continue propofol for sedation Add fent prn   FAMILY  - Updates: Son updated at bedside. - Inter-disciplinary family meet or Palliative Care meeting due by: 8/9   The patient is critically ill with multiple organ systems failure and requires high complexity decision making for assessment and support, frequent evaluation and titration of therapies, application of advanced monitoring technologies and extensive interpretation of multiple databases. Critical Care Time devoted to patient care services described in this note independent of APP time is 31 minutes.     Cyril Mourning MD. Tonny Bollman. [facility] Pager [phone] If no response call [phone]   11/30/2014, 7:37 AM

## 2014-11-30 NOTE — Progress Notes (Signed)
Inpatient Diabetes Program Recommendations  AACE/ADA: New Consensus Statement on Inpatient Glycemic Control (2013)  Target Ranges:  Prepandial:   less than 140 mg/dL      Peak postprandial:   less than 180 mg/dL (1-2 hours)      Critically ill patients:  140 - 180 mg/dL  Results for TEMPRENCE, RHINES ABAD (MRN [id number]) as of 11/30/2014 12:44  Ref. Range 11/29/2014 19:31 11/30/2014 00:02 11/30/2014 04:04 11/30/2014 07:29 11/30/2014 11:56  Glucose-Capillary Latest Ref Range: 65-99 mg/dL 829 (H) 562 (H) 130 (H) 119 (H) 234 (H)    Consider adding Levemir 12 units daily.  Will follow.  Thanks,  Beryl Meager, RN, BC-ADM Inpatient Diabetes Coordinator Pager [phone] (8a-5p)

## 2014-12-01 ENCOUNTER — Inpatient Hospital Stay (HOSPITAL_COMMUNITY): Payer: Self-pay

## 2014-12-01 ENCOUNTER — Encounter (HOSPITAL_COMMUNITY): Payer: Self-pay

## 2014-12-01 DIAGNOSIS — J9600 Acute respiratory failure, unspecified whether with hypoxia or hypercapnia: Secondary | ICD-10-CM | POA: Insufficient documentation

## 2014-12-01 LAB — CBC
HCT: 33.1 % — ABNORMAL LOW (ref 36.0–46.0)
Hemoglobin: 11 g/dL — ABNORMAL LOW (ref 12.0–15.0)
MCH: 27.8 pg (ref 26.0–34.0)
MCHC: 33.2 g/dL (ref 30.0–36.0)
MCV: 83.8 fL (ref 78.0–100.0)
PLATELETS: 302 10*3/uL (ref 150–400)
RBC: 3.95 MIL/uL (ref 3.87–5.11)
RDW: 13.3 % (ref 11.5–15.5)
WBC: 10.6 10*3/uL — AB (ref 4.0–10.5)

## 2014-12-01 LAB — BASIC METABOLIC PANEL
ANION GAP: 8 (ref 5–15)
BUN: 16 mg/dL (ref 6–20)
CALCIUM: 8.8 mg/dL — AB (ref 8.9–10.3)
CO2: 25 mmol/L (ref 22–32)
Chloride: 108 mmol/L (ref 101–111)
Creatinine, Ser: 0.52 mg/dL (ref 0.44–1.00)
GFR calc Af Amer: >60 mL/min (ref >60)
GFR calc non Af Amer: >60 mL/min (ref >60)
Glucose, Bld: 281 mg/dL — ABNORMAL HIGH (ref 65–99)
Potassium: 2.9 mmol/L — ABNORMAL LOW (ref 3.5–5.1)
Sodium: 141 mmol/L (ref 135–145)

## 2014-12-01 LAB — GLUCOSE, CAPILLARY
GLUCOSE-CAPILLARY: 253 mg/dL — AB (ref 65–99)
GLUCOSE-CAPILLARY: 233 mg/dL — AB (ref 65–99)
Glucose-Capillary: 267 mg/dL — ABNORMAL HIGH (ref 65–99)
Glucose-Capillary: 272 mg/dL — ABNORMAL HIGH (ref 65–99)
Glucose-Capillary: 178 mg/dL — ABNORMAL HIGH (ref 65–99)

## 2014-12-01 LAB — TRIGLYCERIDES: Triglycerides: 95 mg/dL (ref <150)

## 2014-12-01 LAB — PHOSPHORUS: PHOSPHORUS: 2.1 mg/dL — AB (ref 2.5–4.6)

## 2014-12-01 LAB — MAGNESIUM: Magnesium: 2 mg/dL (ref 1.7–2.4)

## 2014-12-01 MED ORDER — CHLORHEXIDINE GLUCONATE 0.12% ORAL RINSE (MEDLINE KIT)
15.0000 mL | Freq: Two times a day (BID) | OROMUCOSAL | Status: DC
  Administered 2014-12-01 – 2014-12-02 (×3): 15 mL via OROMUCOSAL

## 2014-12-01 MED ORDER — IOHEXOL 300 MG/ML  SOLN
80.0000 mL | Freq: Once | INTRAMUSCULAR | Status: AC | PRN
  Administered 2014-12-01: 80 mL via INTRAVENOUS

## 2014-12-01 MED ORDER — ANTISEPTIC ORAL RINSE SOLUTION (CORINZ)
7.0000 mL | Freq: Four times a day (QID) | OROMUCOSAL | Status: DC
  Administered 2014-12-01 – 2014-12-02 (×4): 7 mL via OROMUCOSAL

## 2014-12-01 MED ORDER — POTASSIUM CHLORIDE 20 MEQ/15ML (10%) PO SOLN
40.0000 meq | ORAL | Status: AC
  Administered 2014-12-01: 40 meq
  Filled 2014-12-01 (×2): qty 30

## 2014-12-01 MED ORDER — POTASSIUM CHLORIDE 20 MEQ/15ML (10%) PO SOLN
40.0000 meq | ORAL | Status: DC
  Filled 2014-12-01 (×2): qty 30

## 2014-12-01 MED ORDER — POTASSIUM PHOSPHATES 15 MMOLE/5ML IV SOLN
20.0000 mmol | Freq: Once | INTRAVENOUS | Status: AC
  Administered 2014-12-01: 20 mmol via INTRAVENOUS
  Filled 2014-12-01: qty 6.67

## 2014-12-01 NOTE — Procedures (Signed)
Extubation Procedure Note  Patient Details:   Name: Brooke Dorsey DOB: Oct 02, 1960 MRN: [id number]   Airway Documentation:     Evaluation  O2 sats: stable throughout Complications: No apparent complications Patient did tolerate procedure well. Bilateral Breath Sounds: Clear, Diminished Suctioning: Airway Yes   Patient had a positive cuff leak prior to extubation. Post extubation - patient able to speak clearly, bilateral BS, productive cough. Placed on [place] 4 lpm with humidity.  Forest Becker Drexel Ivey 12/01/2014, 4:14 PM

## 2014-12-01 NOTE — Progress Notes (Signed)
PULMONARY / CRITICAL CARE MEDICINE   Name: Brooke Dorsey MRN: [id number] DOB: Aug 17, 1960    ADMISSION DATE:  11/27/2014 CONSULTATION DATE:  11/28/14  REFERRING MD : Dr. Annalee Genta  CHIEF COMPLAINT: Recurrent oropharyngeal/tonsillar cellulitis  INITIAL PRESENTATION: 54 year old female with known history of diabetes presented with a four-day history of worsening left neck pain & sore throat. Patient given IM Rocephin on July 30 but per records unable to tolerate oral antibiotics.  STUDIES:  CT Neck/Soft Tissue (11/28/14): Slight worsening with extensive inflammatory changes surrounding the oropharynx and larynx left greater than right. Inflammatory changes contiguous left submandibular gland. Suggestion of retropharyngeal collection consistent with abscess.  SIGNIFICANT EVENTS: 8/2 - Patient taken to the operating room for worsening retropharyngeal edema concerning for possible tonsillar abscess. No abscess was aspirated but the patient did have significant tissue swelling and remained intubated postoperatively for stabilization and continued ICU level care. Patient continuing on broad-spectrum antibiotics but switching from clindamycin to Unasyn after discussion with ENT. Also getting treatment with Solu-Medrol.   SUBJECTIVE: spanish speaking only Denies pain Agitated on WUA afebrile  VITAL SIGNS: Temp:  [97.2 F (36.2 C)-98.2 F (36.8 C)] 98.2 F (36.8 C) (08/05 0900) Pulse Rate:  [61-97] 73 (08/05 1000) Resp:  [9-24] 17 (08/05 1000) BP: (90-165)/(47-84) 135/70 mmHg (08/05 1000) SpO2:  [100 %] 100 % (08/05 1000) FiO2 (%):  [40 %] 40 % (08/05 0801) Weight:  [134 lb 0.6 oz (60.8 kg)] 134 lb 0.6 oz (60.8 kg) (08/05 0500) HEMODYNAMICS:   VENTILATOR SETTINGS: Vent Mode:  [-] PRVC FiO2 (%):  [40 %] 40 % Set Rate:  [16 bmp] 16 bmp Vt Set:  [380 mL] 380 mL PEEP:  [5 cmH20] 5 cmH20 Plateau Pressure:  [10 cmH20-12 cmH20] 11 cmH20 INTAKE / OUTPUT:  Intake/Output Summary (Last  24 hours) at 12/01/14 1048 Last data filed at 12/01/14 1000  Gross per 24 hour  Intake 1716.97 ml  Output   1600 ml  Net 116.97 ml    PHYSICAL EXAMINATION: General:  Sedated, but arousable Neuro:  No gross focal deficits.  HEENT:  PERRLA, moist mucus membranes  Cardiovascular:  RRR. No murmurs, rubs or gallops Lungs:  Clear Abdomen:  Soft, + BS Skin: Intact Ext- No edema  LABS:  CBC  Recent Labs Lab 11/28/14 0555 11/29/14 0310 12/01/14 0203  WBC 15.7* 9.7 10.6*  HGB 11.6* 11.3* 11.0*  HCT 34.8* 34.2* 33.1*  PLT 243 259 302   Coag's No results for input(s): APTT, INR in the last 168 hours. BMET  Recent Labs Lab 11/28/14 0555 11/29/14 0310 12/01/14 0203  NA 136 138 141  K 3.2* 3.6 2.9*  CL 107 109 108  CO2 17* 19* 25  BUN 10 5* 16  CREATININE 0.60 0.72 0.52  GLUCOSE 194* 169* 281*   Electrolytes  Recent Labs Lab 11/28/14 0555 11/29/14 0310 12/01/14 0203  CALCIUM 8.4* 8.7* 8.8*  MG  --  1.6* 2.0  PHOS  --  1.4* 2.1*   Sepsis Markers  Recent Labs Lab 11/27/14 2210 11/28/14 2021 11/29/14 0310 11/30/14 0534  LATICACIDVEN 1.1  --   --   --   PROCALCITON  --  <0.10 <0.10 <0.10   ABG  Recent Labs Lab 11/28/14 2025 11/29/14 0325  PHART 7.327* 7.352  PCO2ART 32.6* 33.3*  PO2ART 480* 148*   Liver Enzymes  Recent Labs Lab 11/26/14 0905 11/27/14 1951  AST 17 15  ALT 17 14  ALKPHOS 102 102  BILITOT 1.4* 1.2  ALBUMIN 3.4*  3.5   Cardiac Enzymes No results for input(s): TROPONINI, PROBNP in the last 168 hours. Glucose  Recent Labs Lab 11/30/14 1156 11/30/14 1532 11/30/14 1938 11/30/14 2347 12/01/14 0407 12/01/14 0909  GLUCAP 234* 248* 212* 292* 267* 253*    Imaging Dg Chest Port 1 View  12/01/2014   CLINICAL DATA:  Hypoxia  EXAM: PORTABLE CHEST - 1 VIEW  COMPARISON:  November 28, 2014  FINDINGS: Endotracheal tube tip is 4.0 cm above the carina. Nasogastric tube tip and side port are in the stomach. No pneumothorax. There is no  edema or consolidation. The heart size and pulmonary vascularity are normal. No adenopathy. No bone lesions.  IMPRESSION: Tube positions as described without pneumothorax. No edema or consolidation.   Electronically Signed   By: Bretta Bang III M.D.   On: 12/01/2014 07:22     ASSESSMENT / PLAN:  PULMONARY OETT 7.0 8/2 >> A: Intubation for airway protection/preoperative  P:  Vent bundle Daily SBT Albuterol scheduled Solu-Medrol IV- Started on 8/2.ct  Steroids/antibiotics-repeat assess today for extubation given the significant airway edema. Will co-ordinate airway assessment with ENT- will get a laryngoscope and CT neck. If edema better we will extubate.   CARDIOVASCULAR A:  Hypotension postoperative. Now stable  P:  Monitor on telemetry  RENAL A:  Hypokalemia - Replacing IV. hypophos   GASTROINTESTINAL A:  No acute issues  P:  Protonix IV for prophylaxis On TFs  HEMATOLOGIC A:  Anemia - no signs of active bleeding  P:  Monitor hemoglobin daily with CBC Lovenox & SCD for DVT prophylaxis  INFECTIOUS A:  Retropharyngeal/tonsillar cellulitis  P:  Group A Strep (7/30): Negative  Abx:  Clindamycin 8/1 >> 8/2 Unasyn 8/2 >>  ENDOCRINE A:  DM-II   P:  Sliding-scale insulin with Accu-Cheks every 4 hours  NEUROLOGIC A:  No acute issues  P:  RASS goal: 0 to -1 Continue propofol for sedation Add fent prn   FAMILY  - Updates: Son updated at bedside. - Inter-disciplinary family meet or Palliative Care meeting due by: 8/9  CRITICAL CARE Performed by: Chilton Greathouse  Total critical care time: 45  Critical care time was exclusive of separately billable procedures and treating other patients.  Critical care was necessary to treat or prevent imminent or life-threatening deterioration.  Critical care was time spent personally by me on the following activities: development of treatment plan with patient and/or  surrogate as well as nursing, discussions with consultants, evaluation of patient's response to treatment, examination of patient, obtaining history from patient or surrogate, ordering and performing treatments and interventions, ordering and review of laboratory studies, ordering and review of radiographic studies, pulse oximetry and re-evaluation of patient's condition.   Chilton Greathouse MD [facility] Pager [phone] If no answer or after 3pm call: [phone] 12/01/2014, 10:48 AM

## 2014-12-01 NOTE — Progress Notes (Addendum)
Inpatient Diabetes Program Recommendations  AACE/ADA: New Consensus Statement on Inpatient Glycemic Control (2013)  Target Ranges:  Prepandial:   less than 140 mg/dL      Peak postprandial:   less than 180 mg/dL (1-2 hours)      Critically ill patients:  140 - 180 mg/dL  Results for Brooke Dorsey, Brooke Dorsey (MRN [id number]) as of 12/01/2014 10:12  Ref. Range 11/30/2014 15:32 11/30/2014 19:38 11/30/2014 23:47 12/01/2014 04:07 12/01/2014 09:09  Glucose-Capillary Latest Ref Range: 65-99 mg/dL 409 (H) 811 (H) 914 (H) 267 (H) 253 (H)  Results for Brooke Dorsey, Brooke Dorsey (MRN [id number]) as of 12/01/2014 10:15  Ref. Range 11/27/2014 19:51  Hemoglobin A1C Latest Ref Range: 4.8-5.6 % 12.1 (H)   May consider ICU Glycemic control protocol: Phase 2-IV insulin.  Note A1C indicates poor control of diabetes prior to admit.  Will likely need insulin at discharge.  Thanks, Beryl Meager, RN, BC-ADM Inpatient Diabetes Coordinator Pager [phone] (8a-5p)

## 2014-12-01 NOTE — Progress Notes (Signed)
RT assisted patient transport to CT and back  to 2S02 on 100% FIO2. Patient's vitals remained stable. Rt placed patient back on 40% FIO2 upon return to 2S02.

## 2014-12-01 NOTE — Progress Notes (Signed)
   ENT Progress Note: POD #3 s/p Procedure(s): INTUBATION-ENDOTRACHEAL  DIRECT LARYNGOSCOPY WITH BIOPSY   Subjective: Patient intubated and sedated  Objective: Vital signs in last 24 hours: Temp:  [97.2 F (36.2 C)-98.2 F (36.8 C)] 98.2 F (36.8 C) (08/05 0900) Pulse Rate:  [61-97] 73 (08/05 1000) Resp:  [9-24] 17 (08/05 1000) BP: (90-165)/(47-84) 135/70 mmHg (08/05 1000) SpO2:  [100 %] 100 % (08/05 1000) FiO2 (%):  [40 %] 40 % (08/05 0801) Weight:  [60.8 kg (134 lb 0.6 oz)] 60.8 kg (134 lb 0.6 oz) (08/05 0500) Weight change:  Last BM Date: 11/22/14  Intake/Output from previous day: 08/04 0701 - 08/05 0700 In: 1908.9 [I.V.:808.9; NG/GT:500; IV Piggyback:600] Out: 1680 [Urine:1130; Emesis/NG output:550] Intake/Output this shift: Total I/O In: 241.3 [I.V.:71.3; NG/GT:120; IV Piggyback:50] Out: 195 [Urine:195]  Labs:  Recent Labs  11/29/14 0310 12/01/14 0203  WBC 9.7 10.6*  HGB 11.3* 11.0*  HCT 34.2* 33.1*  PLT 259 302    Recent Labs  11/29/14 0310 12/01/14 0203  NA 138 141  K 3.6 2.9*  CL 109 108  CO2 19* 25  GLUCOSE 169* 281*  BUN 5* 16  CALCIUM 8.7* 8.8*    Studies/Results: Dg Chest Port 1 View  12/01/2014   CLINICAL DATA:  Hypoxia  EXAM: PORTABLE CHEST - 1 VIEW  COMPARISON:  November 28, 2014  FINDINGS: Endotracheal tube tip is 4.0 cm above the carina. Nasogastric tube tip and side port are in the stomach. No pneumothorax. There is no edema or consolidation. The heart size and pulmonary vascularity are normal. No adenopathy. No bone lesions.  IMPRESSION: Tube positions as described without pneumothorax. No edema or consolidation.   Electronically Signed   By: Bretta Bang III M.D.   On: 12/01/2014 07:22     PHYSICAL EXAM: Oral tracheal intubation, patient's airway stable.  Flexible Laryngoscopy: Bedside flexible laryngoscopy performed without difficulty. Nasopharynx, base of tongue and supraglottis appear normal with minimal swelling. Some  pooling of secretions. Significant improvement in airway edema from previous examination.   Assessment/Plan: The patient has had significant improvement with her current medical therapy with near resolution of retropharyngeal and supraglottic edema on flexible laryngoscopy today. Patient scheduled for CT scan of the neck to assess for any additional infection or swelling. If CT appears stable would recommend proceeding with extubation. Plan close overnight monitoring in the intensive care unit and if stable may transfer to the floor, medical service for additional follow-up care and continued antibiotics. If patient able to tolerate oral intake and oral antibiotics she may be discharged early next week with close monitoring and follow-up through the medical service for diabetes management and additional infection. Follow-up [facility] as an outpatient, please contact we can call physician if any additional concerns or issues with her airway.    Kanetra Ho 12/01/2014, 10:55 AM

## 2014-12-02 LAB — CBC WITH DIFFERENTIAL/PLATELET
BASOS PCT: 0 % (ref 0–1)
Basophils Absolute: 0 10*3/uL (ref 0.0–0.1)
Eosinophils Absolute: 0 10*3/uL (ref 0.0–0.7)
Eosinophils Relative: 0 % (ref 0–5)
HCT: 35.7 % — ABNORMAL LOW (ref 36.0–46.0)
HEMOGLOBIN: 12 g/dL (ref 12.0–15.0)
LYMPHS ABS: 0.8 10*3/uL (ref 0.7–4.0)
Lymphocytes Relative: 8 % — ABNORMAL LOW (ref 12–46)
MCH: 28.2 pg (ref 26.0–34.0)
MCHC: 33.6 g/dL (ref 30.0–36.0)
MCV: 83.8 fL (ref 78.0–100.0)
MONOS PCT: 5 % (ref 3–12)
Monocytes Absolute: 0.5 10*3/uL (ref 0.1–1.0)
NEUTROS ABS: 9.3 10*3/uL — AB (ref 1.7–7.7)
Neutrophils Relative %: 87 % — ABNORMAL HIGH (ref 43–77)
PLATELETS: 322 10*3/uL (ref 150–400)
RBC: 4.26 MIL/uL (ref 3.87–5.11)
RDW: 13.3 % (ref 11.5–15.5)
WBC: 10.6 10*3/uL — ABNORMAL HIGH (ref 4.0–10.5)

## 2014-12-02 LAB — BASIC METABOLIC PANEL
Anion gap: 6 (ref 5–15)
BUN: 9 mg/dL (ref 6–20)
CO2: 33 mmol/L — AB (ref 22–32)
CREATININE: 0.42 mg/dL — AB (ref 0.44–1.00)
Calcium: 8.2 mg/dL — ABNORMAL LOW (ref 8.9–10.3)
Chloride: 103 mmol/L (ref 101–111)
GFR calc Af Amer: >60 mL/min (ref >60)
GFR calc non Af Amer: >60 mL/min (ref >60)
GLUCOSE: 215 mg/dL — AB (ref 65–99)
POTASSIUM: 3.6 mmol/L (ref 3.5–5.1)
SODIUM: 142 mmol/L (ref 135–145)

## 2014-12-02 LAB — GLUCOSE, CAPILLARY
GLUCOSE-CAPILLARY: 209 mg/dL — AB (ref 65–99)
GLUCOSE-CAPILLARY: 284 mg/dL — AB (ref 65–99)
GLUCOSE-CAPILLARY: 276 mg/dL — AB (ref 65–99)
GLUCOSE-CAPILLARY: 234 mg/dL — AB (ref 65–99)
Glucose-Capillary: 140 mg/dL — ABNORMAL HIGH (ref 65–99)
Glucose-Capillary: 145 mg/dL — ABNORMAL HIGH (ref 65–99)

## 2014-12-02 MED ORDER — ALBUTEROL SULFATE (2.5 MG/3ML) 0.083% IN NEBU
INHALATION_SOLUTION | RESPIRATORY_TRACT | Status: AC
  Filled 2014-12-02: qty 3

## 2014-12-02 MED ORDER — POTASSIUM CHLORIDE 10 MEQ/100ML IV SOLN
10.0000 meq | INTRAVENOUS | Status: AC
  Administered 2014-12-02 (×2): 10 meq via INTRAVENOUS
  Filled 2014-12-02 (×2): qty 100

## 2014-12-02 MED ORDER — METHYLPREDNISOLONE SODIUM SUCC 40 MG IJ SOLR
40.0000 mg | Freq: Two times a day (BID) | INTRAMUSCULAR | Status: DC
  Administered 2014-12-02 – 2014-12-03 (×2): 40 mg via INTRAVENOUS
  Filled 2014-12-02 (×4): qty 1

## 2014-12-02 MED ORDER — ALBUTEROL SULFATE (2.5 MG/3ML) 0.083% IN NEBU: 2.5000 mg | INHALATION_SOLUTION | RESPIRATORY_TRACT | Status: DC | PRN

## 2014-12-02 MED ORDER — CETYLPYRIDINIUM CHLORIDE 0.05 % MT LIQD
7.0000 mL | Freq: Two times a day (BID) | OROMUCOSAL | Status: DC
  Administered 2014-12-03: 7 mL via OROMUCOSAL

## 2014-12-02 MED ORDER — SODIUM CHLORIDE 0.9 % IV SOLN
250.0000 mL | INTRAVENOUS | Status: DC | PRN
  Administered 2014-12-02: 250 mL via INTRAVENOUS

## 2014-12-02 NOTE — Progress Notes (Signed)
PULMONARY / CRITICAL CARE MEDICINE   Name: Brooke Dorsey MRN: [id number] DOB: [birth year]    ADMISSION DATE:  11/27/2014 CONSULTATION DATE:  11/28/14  REFERRING MD : Dr. Annalee Genta  CHIEF COMPLAINT: Recurrent oropharyngeal/tonsillar cellulitis  INITIAL PRESENTATION: 54 year old female with known history of diabetes presented with a four-day history of worsening left neck pain & sore throat. Patient given IM Rocephin on July 30 but per records unable to tolerate oral antibiotics.  STUDIES:  CT Neck/Soft Tissue (11/28/14): Slight worsening with extensive inflammatory changes surrounding the oropharynx and larynx left greater than right. Inflammatory changes contiguous left submandibular gland. Suggestion of retropharyngeal collection consistent with abscess. CT necK 8/5 decreased edema  SIGNIFICANT EVENTS: 8/2 - Patient taken to the operating room for worsening retropharyngeal edema concerning for possible tonsillar abscess. No abscess was aspirated but the patient did have significant tissue swelling and remained intubated postoperatively for stabilization and continued ICU level care. Patient continuing on broad-spectrum antibiotics but switching from clindamycin to Unasyn after discussion with ENT. Also getting treatment with Solu-Medrol. 8/5 extubated    SUBJECTIVE: spanish speaking only Denies pain Afebrile Able to sit up in bed Voice ok  VITAL SIGNS: Temp:  [96.5 F (35.8 C)-98.6 F (37 C)] 98.4 F (36.9 C) (08/06 0400) Pulse Rate:  [62-103] 66 (08/06 0600) Resp:  [13-22] 19 (08/06 0600) BP: (113-165)/(62-89) 122/65 mmHg (08/06 0600) SpO2:  [93 %-100 %] 93 % (08/06 0600) FiO2 (%):  [40 %] 40 % (08/05 1507) Weight:  [58.2 kg (128 lb 4.9 oz)] 58.2 kg (128 lb 4.9 oz) (08/06 0425) HEMODYNAMICS:   VENTILATOR SETTINGS: Vent Mode:  [-] CPAP FiO2 (%):  [40 %] 40 % Set Rate:  [16 bmp] 16 bmp Vt Set:  [380 mL] 380 mL PEEP:  [5 cmH20] 5 cmH20 Pressure Support:  [5 cmH20] 5  cmH20 Plateau Pressure:  [10 cmH20-11 cmH20] 10 cmH20 INTAKE / OUTPUT:  Intake/Output Summary (Last 24 hours) at 12/02/14 0720 Last data filed at 12/02/14 0600  Gross per 24 hour  Intake 1391.3 ml  Output   2150 ml  Net -758.7 ml    PHYSICAL EXAMINATION: General:  Acutely ill,alert, interactive Neuro:  No gross focal deficits.  HEENT:  PERRLA, moist mucus membranes  Cardiovascular:  RRR. No murmurs, rubs or gallops Lungs:  Clear Abdomen:  Soft, + BS Skin: Intact Ext- No edema  LABS:  CBC  Recent Labs Lab 11/29/14 0310 12/01/14 0203 12/02/14 0223  WBC 9.7 10.6* 10.6*  HGB 11.3* 11.0* 12.0  HCT 34.2* 33.1* 35.7*  PLT 259 302 322   Coag's No results for input(s): APTT, INR in the last 168 hours. BMET  Recent Labs Lab 11/29/14 0310 12/01/14 0203 12/02/14 0223  NA 138 141 142  K 3.6 2.9* 3.6  CL 109 108 103  CO2 19* 25 33*  BUN 5* 16 9  CREATININE 0.72 0.52 0.42*  GLUCOSE 169* 281* 215*   Electrolytes  Recent Labs Lab 11/29/14 0310 12/01/14 0203 12/02/14 0223  CALCIUM 8.7* 8.8* 8.2*  MG 1.6* 2.0  --   PHOS 1.4* 2.1*  --    Sepsis Markers  Recent Labs Lab 11/27/14 2210 11/28/14 2021 11/29/14 0310 11/30/14 0534  LATICACIDVEN 1.1  --   --   --   PROCALCITON  --  <0.10 <0.10 <0.10   ABG  Recent Labs Lab 11/28/14 2025 11/29/14 0325  PHART 7.327* 7.352  PCO2ART 32.6* 33.3*  PO2ART 480* 148*   Liver Enzymes  Recent Labs Lab 11/26/14 0905  11/27/14 1951  AST 17 15  ALT 17 14  ALKPHOS 102 102  BILITOT 1.4* 1.2  ALBUMIN 3.4* 3.5   Cardiac Enzymes No results for input(s): TROPONINI, PROBNP in the last 168 hours. Glucose  Recent Labs Lab 12/01/14 0909 12/01/14 1232 12/01/14 1715 12/01/14 2021 12/01/14 2343 12/02/14 0357  GLUCAP 253* 233* 272* 178* 145* 209*    Imaging Ct Soft Tissue Neck W Contrast  12/01/2014   CLINICAL DATA:  Neck infection. Evaluate for improvement prior to extubation.  EXAM: CT NECK WITH CONTRAST   TECHNIQUE: Multidetector CT imaging of the neck was performed using the standard protocol following the bolus administration of intravenous contrast.  CONTRAST:  80mL OMNIPAQUE IOHEXOL 300 MG/ML  SOLN  COMPARISON:  Neck CT from 3 days ago  FINDINGS: Pharynx and larynx: Orogastric and endotracheal intubation with visualized portions of these tubes in good position.  Significant decrease in supraglottic larynx and hypopharyngeal submucosal edema, especially of the aryeoepiglottic fold. Fluid around the tubes somewhat decreases sensitivity, but the submucosal compartment no longer appears expansile. Residual but decreased edema present in the left parapharyngeal region, without displacement of the oropharynx. This fluid does not appear rounded or thickly encapsulated as typical of abscess. There is no tongue base edema. No abscess.  Salivary glands: Unremarkable  Thyroid: Unremarkable  Lymph nodes: Unremarkable  Vascular: Major cervical vessels are patent.  Limited intracranial: Negative  Visualized orbits: Normal  Mastoids and visualized paranasal sinuses: Clear  Skeleton: No contributory findings.  Upper chest: Mild dependent atelectasis. No indication of pneumonia.  IMPRESSION: Significantly improved submucosal edema in the hypopharynx and supraglottic larynx. Residual, but decreased, left parapharyngeal edema without significant mass effect on the pharynx.   Electronically Signed   By: Marnee Spring M.D.   On: 12/01/2014 14:32     ASSESSMENT / PLAN:  PULMONARY OETT 7.0 8/2 >>8/5 A: Intubation for airway protection/preoperative  P:  Albuterol prn  Solu-Medrol IV-  Drop to 40 q 12h  CARDIOVASCULAR A:  Hypotension postoperative. Now stable  P:  dc telemetry  RENAL A:  Hypokalemia - hypophos -repleted  GASTROINTESTINAL A:  No acute issues  P:  Dc Protonix IV for prophylaxis Advance PO  HEMATOLOGIC A:  Anemia - no signs of active bleeding  P:  Monitor hemoglobin  daily with CBC Lovenox & SCD for DVT prophylaxis  INFECTIOUS A:  Retropharyngeal/tonsillar cellulitis  P:  Group A Strep (7/30): Negative  Abx:  Clindamycin 8/1 >> 8/2 Unasyn 8/2 >>  ENDOCRINE A:  DM-II - worse with steroids   P:  Sliding-scale insulin with Accu-Cheks every 4 hours  NEUROLOGIC A:  No acute issues  P:  PT consult   FAMILY  - Updates: Son updated at bedside. - Inter-disciplinary family meet or Palliative Care meeting due by: NA  Summary - markedly decreased upper airway edema on CT & ENt exam , advance PO, transfer to floor & [facility] . Additional abx over weekend & plan for dc in 2-3 days. Can taper steroids over next 2 days to off    Cyril Mourning MD. FCCP. [place] Pager [phone] If no response call [phone]    12/02/2014, 7:20 AM

## 2014-12-02 NOTE — Evaluation (Signed)
Physical Therapy Evaluation Patient Details Name: Brooke Dorsey MRN: [id number] DOB: [birth year] Today's Date: 12/02/2014   History of Present Illness  54 year old female with known history of diabetes presented with a four-day history of worsening left neck pain & sore throat.  CT revealed retropharyngeal collection consistent with abscess.  taken to OR on 11/28/14, no abscess to drain but remained intubated and ICU post OR. Extubated on 12/01/14.  Clinical Impression  Patient presents with decreased balance limiting independence in mobility and gait.  Feel this is most likely due to generalized weakness from prolonged intubation.  Feel patient will benefit from PT to continue to progress mobility and independence for return home with family.  Anticipate steady progress.  Encouraged patient to ambulate frequently with family and nursing staff.     Follow Up Recommendations No PT follow up    Equipment Recommendations  None recommended by PT    Recommendations for Other Services       Precautions / Restrictions Precautions Precautions: None      Mobility  Bed Mobility Overal bed mobility: Modified Independent             General bed mobility comments: used railing  Transfers Overall transfer level: Needs assistance Equipment used: 1 person hand held assist Transfers: Sit to/from Stand Sit to Stand: Min assist            Ambulation/Gait Ambulation/Gait assistance: Min assist Ambulation Distance (Feet): 200 Feet Assistive device: 1 person hand held assist Gait Pattern/deviations: WFL(Within Functional Limits) Gait velocity: decreased   General Gait Details: loss of balance both right and left, probably due to generalized weakness.    Stairs            Wheelchair Mobility    Modified Rankin (Stroke Patients Only)       Balance Overall balance assessment: Needs assistance Sitting-balance support: No upper extremity supported Sitting balance-Leahy  Scale: Good     Standing balance support: No upper extremity supported Standing balance-Leahy Scale: Poor Standing balance comment: required min assist for standing balance                             Pertinent Vitals/Pain Pain Assessment: No/denies pain    Home Living Family/patient expects to be discharged to:: Private residence Living Arrangements: Children Available Help at Discharge: Family Type of Home: Mobile home Home Access: Stairs to enter Entrance Stairs-Rails: None Entrance Stairs-Number of Steps: 4 Home Layout: One level Home Equipment: None      Prior Function Level of Independence: Independent               Hand Dominance        Extremity/Trunk Assessment   Upper Extremity Assessment: Overall WFL for tasks assessed           Lower Extremity Assessment: Overall WFL for tasks assessed      Cervical / Trunk Assessment: Normal  Communication   Communication: Prefers language other than English;No difficulties;Interpreter utilized (Spanish speaking; interpreter line used.  Interpreter ID:  [id number])  Cognition Arousal/Alertness: Awake/alert Behavior During Therapy: WFL for tasks assessed/performed Overall Cognitive Status: Within Functional Limits for tasks assessed                      General Comments      Exercises        Assessment/Plan    PT Assessment Patient needs continued PT services  PT Diagnosis Generalized  weakness   PT Problem List Decreased activity tolerance;Decreased balance;Decreased mobility  PT Treatment Interventions Gait training;Functional mobility training;Therapeutic activities;Therapeutic exercise;Balance training;Patient/family education   PT Goals (Current goals can be found in the Care Plan section) Acute Rehab PT Goals Patient Stated Goal: none obtained PT Goal Formulation: With patient Time For Goal Achievement: 12/09/14 Potential to Achieve Goals: Good    Frequency Min 2X/week    Barriers to discharge        Co-evaluation               End of Session   Activity Tolerance: Patient tolerated treatment well Patient left: in chair;with call bell/phone within reach;with family/visitor present           Time: 4540-9811 PT Time Calculation (min) (ACUTE ONLY): 15 min   Charges:   PT Evaluation $Initial PT Evaluation Tier I: 1 Procedure     PT G CodesOlivia Canter 12/02/2014, 3:08 PM  12/02/2014 [name], PT [phone]

## 2014-12-02 NOTE — Progress Notes (Signed)
[facility] ICU Electrolyte Replacement Protocol  Patient Name: Brooke Dorsey DOB: [birth year] MRN: [id number]  Date of Service  12/02/2014   HPI/Events of Note    Recent Labs Lab 11/27/14 1951 11/28/14 0555 11/29/14 0310 12/01/14 0203 12/02/14 0223  NA 132* 136 138 141 142  K 3.6 3.2* 3.6 2.9* 3.6  CL 101 107 109 108 103  CO2 16* 17* 19* 25 33*  GLUCOSE 299* 194* 169* 281* 215*  BUN 15 10 5* 16 9  CREATININE 0.97 0.60 0.72 0.52 0.42*  CALCIUM 9.2 8.4* 8.7* 8.8* 8.2*  MG  --   --  1.6* 2.0  --   PHOS  --   --  1.4* 2.1*  --     Estimated Creatinine Clearance: 65.2 mL/min (by C-G formula based on Cr of 0.42).  Intake/Output      08/05 0701 - 08/06 0700   I.V. (mL/kg) 333.3 (5.7)   NG/GT 270   IV Piggyback 788   Total Intake(mL/kg) 1391.3 (23.9)   Urine (mL/kg/hr) 2150 (1.5)   Total Output 2150   Net -758.7        - I/O DETAILED x24h    Total I/O In: 210 [I.V.:110; IV Piggyback:100] Out: 1150 [Urine:1150] - I/O THIS SHIFT    ASSESSMENT   eICURN Interventions  ICU Electrolyte Replacement Protocol criteria met. Labs Replaced per protocol. MD notified   ASSESSMENT: MAJOR ELECTROLYTE    Lorene Dy 12/02/2014, 6:16 AM

## 2014-12-03 LAB — BASIC METABOLIC PANEL
Anion gap: 9 (ref 5–15)
BUN: 17 mg/dL (ref 6–20)
CALCIUM: 8.6 mg/dL — AB (ref 8.9–10.3)
CO2: 30 mmol/L (ref 22–32)
Chloride: 98 mmol/L — ABNORMAL LOW (ref 101–111)
Creatinine, Ser: 0.65 mg/dL (ref 0.44–1.00)
GFR calc non Af Amer: >60 mL/min (ref >60)
Glucose, Bld: 319 mg/dL — ABNORMAL HIGH (ref 65–99)
Potassium: 3.3 mmol/L — ABNORMAL LOW (ref 3.5–5.1)
SODIUM: 137 mmol/L (ref 135–145)

## 2014-12-03 LAB — GLUCOSE, CAPILLARY
GLUCOSE-CAPILLARY: 162 mg/dL — AB (ref 65–99)
GLUCOSE-CAPILLARY: 287 mg/dL — AB (ref 65–99)
Glucose-Capillary: 286 mg/dL — ABNORMAL HIGH (ref 65–99)
Glucose-Capillary: 279 mg/dL — ABNORMAL HIGH (ref 65–99)
Glucose-Capillary: 212 mg/dL — ABNORMAL HIGH (ref 65–99)

## 2014-12-03 MED ORDER — AMOXICILLIN-POT CLAVULANATE 875-125 MG PO TABS: 1.0000 | ORAL_TABLET | Freq: Two times a day (BID) | ORAL | Status: DC

## 2014-12-03 MED ORDER — INSULIN ASPART 100 UNIT/ML ~~LOC~~ SOLN: 0.0000 [IU] | SUBCUTANEOUS | Status: DC

## 2014-12-03 MED ORDER — POTASSIUM CHLORIDE 10 MEQ/100ML IV SOLN
10.0000 meq | INTRAVENOUS | Status: AC
  Administered 2014-12-03 (×2): 10 meq via INTRAVENOUS
  Filled 2014-12-03 (×4): qty 100

## 2014-12-03 MED ORDER — PREDNISONE 50 MG PO TABS: ORAL_TABLET | ORAL | Status: AC

## 2014-12-03 NOTE — Care Management Note (Signed)
Case Management Note  Patient Details  Name: Brooke Dorsey MRN: [id number] Date of Birth: 09/21/1960  Subjective/Objective:                   Hyperglycemia & Peritonsillar cellulitis  Action/Plan:  D/c planning.    Expected Discharge Date:  12/03/14               Expected Discharge Plan:  Home/Self Care  In-House Referral:  NA  Discharge planning Services  Follow-up appt scheduled  Post Acute Care Choice:  NA Choice offered to:  NA  DME Arranged:    DME Agency:     HH Arranged:    HH Agency:     Status of Service:  Completed, signed off  Medicare Important Message Given:    Date Medicare IM Given:    Medicare IM give by:    Date Additional Medicare IM Given:    Additional Medicare Important Message give by:     If discussed at Long Length of Stay Meetings, dates discussed:    Additional Comments: 2:30pm  Received verbal referral from weekend CM Freddy Jaksch to contact Dr. Andrey Campanile regarding medication assistance.  Patient has no insurance (currently listed as Medicaid potential).  Paged Dr. Andrey Campanile @  [phone], no response.  Spoke with Dr. Earnest Conroy [phone]), states he has spoken with Dr. Andrey Campanile and is aware of patients d/c planning needs.  CM verified d/c planning needs with Dr. Earnest Conroy.  Also verified prescriptions have been electronically sent to [facility] on [address] [phone]) and patient is ready for d/c.  Advised patient, patient's son & daughter follow up appointment scheduled for 12/08/14 @ 9:30am  with [facility]) with [facility].  Dr. Earnest Conroy states he is aware and in agreement with patient not receiving an ENT follow up appointment at this time and will follow up with [facility] instead.  Spoke with patient (son spoke with patient in Spanish & in English to CM ), who gave verbal permission to speak with son Kandyce Rud) and daughter  Cliff).   Son's given Match letter, [facility] follow up brochure and list of Match  participating pharmacies.  Son will go to [facility] on [address] today before 6pm and have patient's medication transferred to [facility] from [facility].   AVS updated.   Patient's nurse [place] aware of above.  No futher CM needs identified at this time.    Shelda Pal, RN 12/03/2014, 4:29 PM

## 2014-12-03 NOTE — Progress Notes (Signed)
  Date: 12/03/2014  Patient name: Brooke Dorsey  Medical record number: [id number]  Date of birth: 1960-11-27   This patient has been seen and the plan of care was discussed with the house staff. Please see their note for complete details. I concur with their findings with the following additions/corrections: Dr Andrey Campanile and Earnest Conroy saw pt this AM - she had no sxs, was able to eat well, and swallow pills. She is on IV unasyn. Question is whether to D/C today or tomorrow. Repeat flexible laryngoscopy on hte 5th showed new resolution of the edema. CT on the 5th showed sig improved edema and no mass effect on the pharynx. When I saw pt, her daughter had left for work. She is breathing comfortably, no excessive secretions, and no neck edema. She is clinically stable. If she were to stay another day, i would just provide one more day of IV ABX but hte PO Augmentin should provide adequate coverage. If pt is comfortable going home, has F/U appt, and can get the pills, she may go home today and have close F/U.   Burns Spain, MD 12/03/2014, 1:36 PM

## 2014-12-03 NOTE — Progress Notes (Signed)
Patient ID: Brooke Dorsey, female   DOB: 1961/01/08, 54 y.o.   MRN: [id number]   Subjective: I saw Leanord Asal with Student Doctor Geralyn Corwin this morning who is fluent in Spanish.  Sra. Simkin says she is doing much better this morning. Her throat swelling is gone, she has no pain, she is eating pancakes, and she's able to swallow her pills without a problem. She does have a slight non-productive cough but she is afebrile. Furthermore she denies itching/rash, shortness of breath, chest pain, swelling in her legs.  Objective: Vital signs in last 24 hours: Filed Vitals:   12/02/14 1058 12/02/14 1414 12/02/14 2030 12/03/14 0438  BP: 130/74 132/70 104/68 117/71  Pulse: 73 66 71 72  Temp: 98.2 F (36.8 C) 98 F (36.7 C) 98.5 F (36.9 C) 98.1 F (36.7 C)  TempSrc:   Oral Oral  Resp: 15 15 16 16   Height:      Weight:    57.6 kg (126 lb 15.8 oz)  SpO2: 99% 99% 94% 95%   Weight change: -0.6 kg (-1 lb 5.2 oz)  Intake/Output Summary (Last 24 hours) at 12/03/14 0935 Last data filed at 12/02/14 1723  Gross per 24 hour  Intake    250 ml  Output      0 ml  Net    250 ml   General: Sitting in recliner eating breakfast HEENT: No scleral icterus. Some mild fullness below the left mandible, much-improved from 4 days ago, but no overt lymphadenopathy. Her throat is clear, uvula midline, no erythema or edema.  Cardiac: RRR, no rubs, murmurs or gallops Pulm: clear to auscultation bilaterally, moving normal volumes of air Abd: soft, nontender, nondistended, BS present Ext: warm and well perfused, no pedal edema   Micro Results: Group A Strep Culture: Negative  Imaging: Ct Soft Tissue Neck W Contrast  12/01/2014   CLINICAL DATA:  Neck infection. Evaluate for improvement prior to extubation.  EXAM: CT NECK WITH CONTRAST  TECHNIQUE: Multidetector CT imaging of the neck was performed using the standard protocol following the bolus administration of intravenous contrast.  CONTRAST:   80mL OMNIPAQUE IOHEXOL 300 MG/ML  SOLN  COMPARISON:  Neck CT from 3 days ago  FINDINGS: Pharynx and larynx: Orogastric and endotracheal intubation with visualized portions of these tubes in good position.  Significant decrease in supraglottic larynx and hypopharyngeal submucosal edema, especially of the aryeoepiglottic fold. Fluid around the tubes somewhat decreases sensitivity, but the submucosal compartment no longer appears expansile. Residual but decreased edema present in the left parapharyngeal region, without displacement of the oropharynx. This fluid does not appear rounded or thickly encapsulated as typical of abscess. There is no tongue base edema. No abscess.  Salivary glands: Unremarkable  Thyroid: Unremarkable  Lymph nodes: Unremarkable  Vascular: Major cervical vessels are patent.  Limited intracranial: Negative  Visualized orbits: Normal  Mastoids and visualized paranasal sinuses: Clear  Skeleton: No contributory findings.  Upper chest: Mild dependent atelectasis. No indication of pneumonia.  IMPRESSION: Significantly improved submucosal edema in the hypopharynx and supraglottic larynx. Residual, but decreased, left parapharyngeal edema without significant mass effect on the pharynx.   Electronically Signed   By: Marnee Spring M.D.   On: 12/01/2014 14:32   Medications: I have reviewed the patient's current medications. Scheduled Meds: . ampicillin-sulbactam (UNASYN) IV  1.5 g Intravenous Q6H  . antiseptic oral rinse  7 mL Mouth Rinse BID  . enoxaparin (LOVENOX) injection  40 mg Subcutaneous Q24H  . insulin aspart  0-20 Units Subcutaneous 6 times per day  . methylPREDNISolone (SOLU-MEDROL) injection  40 mg Intravenous Q12H  . pneumococcal 23 valent vaccine  0.5 mL Intramuscular Tomorrow-1000  . potassium chloride  10 mEq Intravenous Q1 Hr x 4   Continuous Infusions:  PRN Meds:.sodium chloride, albuterol, fentaNYL (SUBLIMAZE) injection, [DISCONTINUED] ondansetron **OR** ondansetron  (ZOFRAN) IV  Assessment/Plan:  Peritonsillar Cellulitis: Her neck edema has improved drastically today after 4 days of ampicillin/sulbactam. She was group A strep negative. She complains of a slight cough today, which initially concerned me for aspiration pneumonia, but she denies other symptoms such as fever or pleuritic chest pain and her pulmonary exam is benign; thus don't think she has aspiration pneumonia and I believe she can be discharged today on amoxicillin/clavulanate for a 14 day course with close follow-up. We will prescribe her one more day of prednisone, without a taper, because she only received 2 doses  methylprednisone while she was here. -Discharge today -Discharge with 14 day course of PO Augmentin (stop date 12/12/2014) -Discharge with Prednisone  for 1 day  Type 2 Diabetes Mellitus: Glu 279, likely from steroids -Continue home regiment upon discharge -She will need follow-up  Hypokalemia: K 3.3 today. -Repleted with 4 runs K  Dispo: Home today. We will schedule her a follow-up appointment with [facility] next week.  The patient does not know have transportation limitations that hinder transportation to clinic appointments.  .Services Needed at time of discharge: Y = Yes, Blank = No PT:   OT:   RN:   Equipment:   Other:     LOS: 6 days   Selina Cooley, MD 12/03/2014, 9:35 AM

## 2014-12-03 NOTE — Progress Notes (Signed)
Subjective: Brooke Dorsey was evaluated this morning.  She was accompanied by family members.  She states she feels better.  She reports no more left sided neck swelling, SOB or throat tightness.  She no longer uses [place] or suction device.  She states she has been able to walk around in her room.  She had pancakes for breakfast and is able to swallow water without difficulty. She says she will be able to swallow pills if necessary. She states she has a cough that is non productive.  She denies fever/chills, chest pain, abdominal pain, or muscle weakness, or peripheral edema.    Objective: Vital signs in last 24 hours: Filed Vitals:   12/02/14 1058 12/02/14 1414 12/02/14 2030 12/03/14 0438  BP: 130/74 132/70 104/68 117/71  Pulse: 73 66 71 72  Temp: 98.2 F (36.8 C) 98 F (36.7 C) 98.5 F (36.9 C) 98.1 F (36.7 C)  TempSrc:   Oral Oral  Resp: 15 15 16 16   Height:      Weight:    57.6 kg (126 lb 15.8 oz)  SpO2: 99% 99% 94% 95%   Weight change: -0.6 kg (-1 lb 5.2 oz)  Intake/Output Summary (Last 24 hours) at 12/03/14 0936 Last data filed at 12/02/14 1723  Gross per 24 hour  Intake    250 ml  Output      0 ml  Net    250 ml   General: sitting in chair next to bed in no acute distress HEENT: PERRL, EOMI, no scleral icterus, Uvula midline, no swelling noted in throat, left sided lymphadenopathy Cardiac: RRR, no rubs, murmurs or gallops Pulm: clear to auscultation bilaterally, moving normal volumes of air Abd: soft, nontender, nondistended, BS present Ext: warm and well perfused, no pedal edema Neuro: alert and oriented X3   Lab Results: @LABTEST2 @ Micro Results: Recent Results (from the past 240 hour(s))  Culture, Group A Strep     Status: None   Collection Time: 11/25/14  2:08 PM  Result Value Ref Range Status   Organism ID, Bacteria Normal Upper Respiratory Flora  Final   Organism ID, Bacteria No Beta Hemolytic Streptococci Isolated  Final  MRSA PCR Screening     Status: None     Collection Time: 11/28/14  4:04 PM  Result Value Ref Range Status   MRSA by PCR NEGATIVE NEGATIVE Final    Comment:        The GeneXpert MRSA Assay (FDA approved for NASAL specimens only), is one component of a comprehensive MRSA colonization surveillance program. It is not intended to diagnose MRSA infection nor to guide or monitor treatment for MRSA infections.    Studies/Results: Ct Soft Tissue Neck W Contrast  12/01/2014   CLINICAL DATA:  Neck infection. Evaluate for improvement prior to extubation.  EXAM: CT NECK WITH CONTRAST  TECHNIQUE: Multidetector CT imaging of the neck was performed using the standard protocol following the bolus administration of intravenous contrast.  CONTRAST:  80mL OMNIPAQUE IOHEXOL 300 MG/ML  SOLN  COMPARISON:  Neck CT from 3 days ago  FINDINGS: Pharynx and larynx: Orogastric and endotracheal intubation with visualized portions of these tubes in good position.  Significant decrease in supraglottic larynx and hypopharyngeal submucosal edema, especially of the aryeoepiglottic fold. Fluid around the tubes somewhat decreases sensitivity, but the submucosal compartment no longer appears expansile. Residual but decreased edema present in the left parapharyngeal region, without displacement of the oropharynx. This fluid does not appear rounded or thickly encapsulated as typical of abscess.  There is no tongue base edema. No abscess.  Salivary glands: Unremarkable  Thyroid: Unremarkable  Lymph nodes: Unremarkable  Vascular: Major cervical vessels are patent.  Limited intracranial: Negative  Visualized orbits: Normal  Mastoids and visualized paranasal sinuses: Clear  Skeleton: No contributory findings.  Upper chest: Mild dependent atelectasis. No indication of pneumonia.  IMPRESSION: Significantly improved submucosal edema in the hypopharynx and supraglottic larynx. Residual, but decreased, left parapharyngeal edema without significant mass effect on the pharynx.    Electronically Signed   By: Marnee Spring M.D.   On: 12/01/2014 14:32   Medications: I have reviewed the patient's current medications. Scheduled Meds: . ampicillin-sulbactam (UNASYN) IV  1.5 g Intravenous Q6H  . antiseptic oral rinse  7 mL Mouth Rinse BID  . enoxaparin (LOVENOX) injection  40 mg Subcutaneous Q24H  . insulin aspart  0-20 Units Subcutaneous 6 times per day  . methylPREDNISolone (SOLU-MEDROL) injection  40 mg Intravenous Q12H  . pneumococcal 23 valent vaccine  0.5 mL Intramuscular Tomorrow-1000  . potassium chloride  10 mEq Intravenous Q1 Hr x 4   Continuous Infusions:  PRN Meds:.sodium chloride, albuterol, fentaNYL (SUBLIMAZE) injection, [DISCONTINUED] ondansetron **OR** ondansetron (ZOFRAN) IV Assessment/Plan: Active Problems:   Tonsillar cellulitis/neck swelling:  On 8/2 patient was taken to OR for intubation and I&D for possible retropharyngeal abscess.  No abscess was found but significant tissue swelling was noted and biopsy taken.  She was transferred to the ICU and antibiotics changed from Clindamycin to Unasyn and started on Solumedrol.  Left sided neck swelling showed improvement on 8/5 after repeat CT scan.  Patient was extubated on 8/5 and tolerated procedure well.  Patient was transferred to floor on 8/6 and vitals stable.  On 8/7 patient vitals remain stable and O2 stats 95% on room air. -Consider discharge due to no longer having neck pain, swelling or SOB.  Patient is able to eat and swallow liquids without issues.  - Change antibiotics from Unasyn to Augmentin on discharge - Taper Solumedrol /ml and discharge on oral prednisone    Diabetes mellitus type 2, uncontrolled - BS range between  151-321. -Continue Novolog  - Diabetes Coordinator suggests insulin at discharge     Acute respiratory failure:  2/2 neck swelling from peritonsillar cellulitis.  Patient showed improvement on 8/2 after receiving Clindamycin however, patient deescalated that evening  reporting that her throat was closing up.  Patient was intubated and transferred to ICU the evening of 8/2.  On 8/5 patient was extubated.  Per note post extubation patient was able to speak clearly, bilateral BS, productive cough. Placed on [place] 4 lpm with humidity.  Patient was transferred to floor on 8/6 and currently vitals are stable and patient is 95% O2 on room air without respiratory distress.   This is a Psychologist, occupational Note.  The care of the patient was discussed with Dr. Andrey Campanile and the assessment and plan formulated with their assistance.  Please see their attached note for official documentation of the daily encounter.   LOS: 6 days   Camelia Phenes, Med Student 12/03/2014, 9:36 AM

## 2014-12-03 NOTE — Progress Notes (Signed)
Brooke Dorsey discharged home per MD order. Discharge instructions reviewed and discussed with patient/family. All questions and concerns answered. Copy of instructions and scripts given to patient. IV removed.  Patient escorted to car by staff in a wheelchair. No distress noted upon discharge.   [name] 12/03/2014 4:31 PM

## 2014-12-03 NOTE — Discharge Summary (Signed)
Name: Brooke Dorsey MRN: [id number] DOB: 1960/10/10 54 y.o. PCP: No Pcp Per Patient  Date of Admission: 11/27/2014  7:52 PM Date of Discharge: 12/03/2014 Attending Physician: Burns Spain, MD  Discharge Diagnosis: 1. Peritonsillar cellulitis 2. Type 2 diabetes mellitus  Discharge Medications:   Medication List    STOP taking these medications        amoxicillin-clavulanate 400-57 MG/5ML suspension  Commonly known as:  AUGMENTIN  Replaced by:  amoxicillin-clavulanate 875-125 MG per tablet     clindamycin 300 MG capsule  Commonly known as:  CLEOCIN     HYDROcodone-acetaminophen 7.5-325 mg/15 ml solution  Commonly known as:  HYCET     ondansetron 4 MG disintegrating tablet  Commonly known as:  ZOFRAN-ODT      TAKE these medications        amoxicillin-clavulanate 875-125 MG per tablet  Commonly known as:  AUGMENTIN  Take 1 tablet by mouth 2 (two) times daily. Para su infeccion     insulin lispro protamine-lispro (75-25) 100 UNIT/ML Susp injection  Commonly known as:  HUMALOG 75/25 MIX  Inject 10 Units into the skin every morning.     predniSONE 50 MG tablet  Commonly known as:  DELTASONE  Toma una pastilla el lunes 8/8 para su infeccion        Disposition and follow-up:   Brooke Dorsey was discharged from [facility] in stable condition.  At the hospital follow up visit please address:  1.  Resolution of peritonsillar abscess  2.  Labs / imaging needed at time of follow-up: None.  3.  Pending labs/ test needing follow-up: None.  Follow-up Appointments: We will call [facility] for follow-up in one week. We will schedule the ENT appointment.  Consultations: ENT  Procedures Performed:  Ct Soft Tissue Neck W Contrast  12/01/2014   CLINICAL DATA:  Neck infection. Evaluate for improvement prior to extubation.  EXAM: CT NECK WITH CONTRAST  TECHNIQUE: Multidetector CT imaging of the neck was performed using the standard protocol  following the bolus administration of intravenous contrast.  CONTRAST:  80mL OMNIPAQUE IOHEXOL 300 MG/ML  SOLN  COMPARISON:  Neck CT from 3 days ago  FINDINGS: Pharynx and larynx: Orogastric and endotracheal intubation with visualized portions of these tubes in good position.  Significant decrease in supraglottic larynx and hypopharyngeal submucosal edema, especially of the aryeoepiglottic fold. Fluid around the tubes somewhat decreases sensitivity, but the submucosal compartment no longer appears expansile. Residual but decreased edema present in the left parapharyngeal region, without displacement of the oropharynx. This fluid does not appear rounded or thickly encapsulated as typical of abscess. There is no tongue base edema. No abscess.  Salivary glands: Unremarkable  Thyroid: Unremarkable  Lymph nodes: Unremarkable  Vascular: Major cervical vessels are patent.  Limited intracranial: Negative  Visualized orbits: Normal  Mastoids and visualized paranasal sinuses: Clear  Skeleton: No contributory findings.  Upper chest: Mild dependent atelectasis. No indication of pneumonia.  IMPRESSION: Significantly improved submucosal edema in the hypopharynx and supraglottic larynx. Residual, but decreased, left parapharyngeal edema without significant mass effect on the pharynx.   Electronically Signed   By: Marnee Spring M.D.   On: 12/01/2014 14:32   Ct Soft Tissue Neck W Contrast  11/28/2014   CLINICAL DATA:  Neck pain. Trouble swallowing. Patient feels like her throat is closing up.  EXAM: CT NECK WITH CONTRAST  TECHNIQUE: Multidetector CT imaging of the neck was performed using the standard protocol following the bolus administration  of intravenous contrast.  CONTRAST:  75mL OMNIPAQUE IOHEXOL 300 MG/ML  SOLN  COMPARISON:  11/26/2014.  FINDINGS: Findings are similar to the recent prior CT. There is heterogeneous, predominantly low attenuation edema/cellulitis that extends from the soft palate superiorly, surrounds the  palatine tonsils, left greater than right, and contacts the submandibular glands, greater on the left. The inflammatory changes extend to the false cords and area for got folds and chest to the superior margin of the true vocal cords. There is a more defined retropharyngeal fluid collection, which measures 2 cm x 1.9 cm x 2.1 cm, which has mildly increased in size from the prior study and is better defined. It previously measured approximately 16 mm x 12 mm x 16 mm. The oral pharyngeal airway is narrowed anterior to this collection, similar to the prior exam. The upper laryngeal airway is also narrowed, similar to the prior study. Hazy inflammatory changes seen in the subcutaneous fat of the submental region.  Superior to the level of the soft palate, the parapharyngeal spaces are unremarkable. The masticator spaces are also unremarkable.  Salivary glands: There is heterogeneous attenuation of the left submandibular gland with more homogeneous attenuation of the right submandibular gland. Left submandibular gland is mildly larger than the right. This may be reactive to the adjacent inflammation. It could potentially be origin, felt less likely. The parotid glands are unremarkable.  Thyroid: Small ill-defined hypo attenuating lesions are noted consistent with sub cm nodules, stable.  Lymph nodes: Mildly enlarged left sided lymph nodes are noted. There is an 11 mm short axis level 2 jugulodigastric node. A slightly larger node is seen just below this on the left measuring just over 12 mm in short axis. These are stable. No new adenopathy.  Vascular: Unremarkable.  Limited intracranial: Unremarkable.  Visualized orbits: Unremarkable.  Mastoids and visualized paranasal sinuses: Clear.  Skeleton: No significant abnormality. No bone resorption is seen to suggest osteomyelitis.  Upper chest: No upper mediastinal mass or enlarged lymph node. Visualized upper lungs are clear.  IMPRESSION: 1. Slight worsening when compared to  the prior CT. 2. There are persistent, extensive inflammatory changes surrounding and involving the oral pharynx and larynx, left greater than right. Inflammatory changes are contiguous with the left submandibular gland, which is also heterogeneous in attenuation and slightly larger than the right submandibular gland. 3. There is a retropharyngeal collection consistent with an abscess, which is better defined and larger than it was previously. Currently it measures 2 x 1.9 x 2.1 cm. No other discrete collection. 4. Oral pharyngeal and upper laryngeal airway is narrowed similar to the prior study.   Electronically Signed   By: Amie Portland M.D.   On: 11/28/2014 16:10   Admission HPI: Brooke Dorsey is a 54 y.o. female with past medical history of type II DM, insulin dependent who presented to the emergency department with 4 days of worsening symptoms related to left neck swelling, painful swallowing, and sore throat. She reports that she has not been able to eat or drink at home secondary to pain. She was seen in an urgent care with similar complaints on 7/30. She was treated with Rocephin 1g IM, Augmentin suspension, and hydrocodone syrup prn for pain. At that time she had a negative rapid strep test, group A culture grew normal upper respiratory flora, and CBC showed elevated WBC of 16.1. Follow-up WBC on 7/31 in the ED was 16.4. Today on admission WBC 18.9 with 16.0 bands and normal lactic acid. Patient  reports vomiting x 1 yesterday. At some point she was seen by ENT as well and given PO clindamycin. She reports that she is unable to take this medication secondary to pain. Denies any fever, chills, chest pain, shortness of breath, abdominal pain.    Hospital Course by problem list:  1. Tonsillar cellulitis/neck swelling:  Patient presented to the ED on 7/31 with 4 day hx of left sided neck pain, swelling and dysphagia.  Patient reported being treated outpatient with Rocephin 1g IM,  Augmentin suspension, and hydrocodone syrup prn for pain.  However, patient reports not being able to take medication due to N/V and reported to the ED for worsening of symptoms.  CT on 7/31 showed asymmetric edema involving the left palatine tonsil, epiglottis, and retropharyngeal soft tissues.  Patient was admitted on 8/2 with WBC of 15.7 and temp of 99.9 and placed on IV Clindamycin.  Patient reported swelling and pain improvement but deescalated in the afternoon due to increased secretions and throat tightness.  Repeat CT on 8/2 showed extensive inflammatory changes surrounding and involving the oral pharynx and larynx. Patient was evaluated by ENT, taken to OR for intubation and I&D for possible retropharyngeal abscess.  No abscess was found but significant tissue swelling was noted and biopsy taken.  She was transferred to the ICU and antibiotics changed from Clindamycin to Unasyn and started on Solumedrol .  She remained in the ICU from 8/2-8/5.  Left sided neck swelling showed improvement on 8/5 after repeat CT scan.  Patient was extubated on 8/5 and tolerated procedure well. Patient was transferred to floor on 8/6. On 8/7 patient vitals continued to remain stable and O2 stats 95% on room air.  Will discharge with Augmentin for 14 days total (stop date 8/16) and 1 day course of oral prednisone. Follow up with ENT. Follow-up appointment with [facility] clinic in 1 week to access improvement of symptoms.  2. Diabetes mellitus type 2, uncontrolled - BS range in hospital between  151-321, likely elevated from methylprednisone.  Continue home med of Humalog.  Discharge Vitals:   BP 117/71 mmHg  Pulse 72  Temp(Src) 98.1 F (36.7 C) (Oral)  Resp 16  Ht  (1.549 m)  Wt 57.6 kg (126 lb 15.8 oz)  BMI 24.01 kg/m2  SpO2 95%  Discharge Labs:  Results for orders placed or performed during the hospital encounter of 11/27/14 (from the past 24 hour(s))  Glucose, capillary     Status: Abnormal    Collection Time: 12/02/14  2:01 PM  Result Value Ref Range   Glucose-Capillary 284 (H) 65 - 99 mg/dL   Comment 1 Notify RN   Glucose, capillary     Status: Abnormal   Collection Time: 12/02/14  4:09 PM  Result Value Ref Range   Glucose-Capillary 276 (H) 65 - 99 mg/dL   Comment 1 Notify RN   Glucose, capillary     Status: Abnormal   Collection Time: 12/02/14 10:13 PM  Result Value Ref Range   Glucose-Capillary 234 (H) 65 - 99 mg/dL  Glucose, capillary     Status: Abnormal   Collection Time: 12/03/14 12:05 AM  Result Value Ref Range   Glucose-Capillary 287 (H) 65 - 99 mg/dL  Glucose, capillary     Status: Abnormal   Collection Time: 12/03/14  4:42 AM  Result Value Ref Range   Glucose-Capillary 286 (H) 65 - 99 mg/dL  Basic metabolic panel     Status: Abnormal   Collection Time: 12/03/14  5:52 AM  Result Value Ref Range   Sodium 137 135 - 145 mmol/L   Potassium 3.3 (L) 3.5 - 5.1 mmol/L   Chloride 98 (L) 101 - 111 mmol/L   CO2 30 22 - 32 mmol/L   Glucose, Bld 319 (H) 65 - 99 mg/dL   BUN 17 6 - 20 mg/dL   Creatinine, Ser 2.95 0.44 - 1.00 mg/dL   Calcium 8.6 (L) 8.9 - 10.3 mg/dL   GFR calc non Af Amer >60 >60 mL/min   GFR calc Af Amer >60 >60 mL/min   Anion gap 9 5 - 15  Glucose, capillary     Status: Abnormal   Collection Time: 12/03/14  8:06 AM  Result Value Ref Range   Glucose-Capillary 162 (H) 65 - 99 mg/dL   Comment 1 Notify RN   Glucose, capillary     Status: Abnormal   Collection Time: 12/03/14 11:27 AM  Result Value Ref Range   Glucose-Capillary 279 (H) 65 - 99 mg/dL   Comment 1 Notify RN     Signed: Selina Cooley, MD 12/03/2014

## 2014-12-03 NOTE — Discharge Instructions (Signed)
Celulitis periamigdalina  (Peritonsillar Cellulitis) La celulitis periamigdalina es una infeccin alrededor de Designer, jewellery. Esta infeccin suele afectar a [employer]. El resultado es un fuerte dolor de Advertising copywriter. La celulitis periamigdalina puede aparecer a cualquier edad. Generalmente se desarrolla en personas que han sufrido dolores de garganta frecuentes y que han tomado antibiticos con frecuencia.  CAUSAS  La celulitis periamigdalina suele tener su origen en ms de un tipo de germen (bacterias).  SNTOMAS  En un comienzo, puede parecer como un dolor de garganta habitual. Pero en este caso el dolor no desaparece luego de [employer]. En cambio, empeora.   Los primeros sntomas de la celulitis periamigdalina pueden ser:  [place] Ruts o escalofros.  Dolor de Metallurgist un solo lado.  Dolor en un odo.  Dolor al tragar.  Sentirse ms cansado que de [place].  Entre los sntomas posteriores se incluyen:  Dolor intenso al tragar.  Babeo.  Dificultad para abrir Government social research officer.  Mal aliento.  Cambios en la voz. DIAGNSTICO  En la mayora de los [place], [place] mdico har el diagnstico al [employer] sntomas, haciendo un examen de la garganta y tomando un cultivo de las secreciones de Administrator. Los [employer] de sangre tambin pueden ayudar a Production assistant, radio causa de su dolor de Advertising copywriter.  TRATAMIENTO  No se trata de un dolor de garganta comn. Es una enfermedad que debe tratarse rpidamente. Si no se trata, puede haber hinchazn y pus (absceso).   Esta afeccin generalmente se trata con antibiticos. Estas infecciones requieren antibiticos por va oral durante un total de 10 das o antibiticos administrados por vena (intravenoso, IV).  Podrn recetarle medicamentos para calmar el dolor o la fiebre.  En algunos casos se recetan medicamentos para disminuir la inflamacin (corticoides).  Si se ha formado un absceso, puede ser necesario drenarlo.  Las personas que han repetido  los casos de celulitis periamigdalina pueden necesitar una operacin para extirpar las amgdalas (amigdalectoma). INSTRUCCIONES PARA EL CUIDADO EN EL HOGAR   Tome todos los medicamentos segn le indic su mdico. Finalice todos los antibiticos, aunque comience a sentirse mejor.  Es normal sentir un poco de Copywriter, advertising. Tome los analgsicos segn las indicaciones del mdico. No tome otros medicamentos para el dolor excepto que lo autorice el mdico.  Hgase grgaras con agua tibia con sal. Mezcle 1 cucharadita (5 gramos) de sal en 1 taza (250 ml) de agua tibia. Debe hacerse las grgaras durante 30 segundos o ms antes de [employer]. Hgalas de 3 a 4 veces al da o segn sea necesario. Esto le Engineer, materials y la hinchazn.  Ser necesario que siga una dieta lquida o blanda si no puede tragar.  Es importante beber lquidos. Beba gran cantidad de lquido para mantener la orina de tono claro o color amarillo plido.  No fume.  Haga reposo y [place].  Si el mdico le ha dado fecha para una visita de control, es importante que concurra. El mdico necesitar asegurarse de que la infeccin [place]. Es importante controlar que no se forme un absceso.  Regrese a su trabajo o a la escuela cuando el profesional se lo indique. SOLICITE ATENCIN MDICA SI:   La hinchazn aumenta.  Tiene dificultad para tragar.  No puede tomar los antibiticos. SOLICITE ATENCIN MDICA DE INMEDIATO SI:   Tiene dificultad para respirar.  El dolor [place], a pesar de haber tomado analgsicos.  Tiene pus alrededor o cerca de las [place].  [place]  la voz.  Babea.  Tose y escupe esputo sanguinolento.  No puede tragar.  Tiene fiebre. ASEGRESE DE QUE:   Comprende estas instrucciones.  Controlar su enfermedad.  Solicitar ayuda de inmediato si no mejora o si empeora. Document Released: 03/31/2012 [facility] Patient Information 2015 [place], [place]. This information is not  intended to replace advice given to you by your health care provider. Make sure you discuss any questions you have with your health care provider.

## 2014-12-08 ENCOUNTER — Encounter: Payer: Self-pay | Admitting: Internal Medicine

## 2014-12-08 ENCOUNTER — Telehealth: Payer: Self-pay

## 2014-12-08 ENCOUNTER — Ambulatory Visit: Payer: Self-pay | Attending: Internal Medicine | Admitting: Internal Medicine

## 2014-12-08 VITALS — BP 102/67 | HR 76 | Temp 98.4°F | Resp 18 | Ht 60.0 in | Wt 128.0 lb

## 2014-12-08 DIAGNOSIS — J36 Peritonsillar abscess: Secondary | ICD-10-CM

## 2014-12-08 DIAGNOSIS — E1165 Type 2 diabetes mellitus with hyperglycemia: Secondary | ICD-10-CM

## 2014-12-08 DIAGNOSIS — Z79899 Other long term (current) drug therapy: Secondary | ICD-10-CM | POA: Insufficient documentation

## 2014-12-08 DIAGNOSIS — IMO0002 Reserved for concepts with insufficient information to code with codable children: Secondary | ICD-10-CM

## 2014-12-08 DIAGNOSIS — Z794 Long term (current) use of insulin: Secondary | ICD-10-CM | POA: Insufficient documentation

## 2014-12-08 DIAGNOSIS — Z7952 Long term (current) use of systemic steroids: Secondary | ICD-10-CM | POA: Insufficient documentation

## 2014-12-08 DIAGNOSIS — Z23 Encounter for immunization: Secondary | ICD-10-CM

## 2014-12-08 LAB — GLUCOSE, POCT (MANUAL RESULT ENTRY)
POC Glucose: 350 mg/dl — AB (ref 70–99)
POC Glucose: 264 mg/dl — AB (ref 70–99)

## 2014-12-08 MED ORDER — INSULIN ASPART 100 UNIT/ML ~~LOC~~ SOLN: 0.0000 [IU] | Freq: Three times a day (TID) | SUBCUTANEOUS | Status: AC

## 2014-12-08 MED ORDER — "INSULIN SYRINGE-NEEDLE U-100 31G X 15/64"" 0.5 ML MISC": Status: AC

## 2014-12-08 MED ORDER — INSULIN GLARGINE 100 UNIT/ML ~~LOC~~ SOLN: 10.0000 [IU] | Freq: Every day | SUBCUTANEOUS | Status: AC

## 2014-12-08 MED ORDER — INSULIN ASPART 100 UNIT/ML ~~LOC~~ SOLN
20.0000 [IU] | Freq: Once | SUBCUTANEOUS | Status: AC
  Administered 2014-12-08: 20 [IU] via SUBCUTANEOUS

## 2014-12-08 NOTE — Progress Notes (Signed)
Hospital follow up for DM. Patients current glucose 350.   Patient last used insulin yesterday morning.   Patient denies pain at this time.   Patient reports using novolog once a day in the morning and does not use humalog.

## 2014-12-08 NOTE — Patient Instructions (Signed)
Recuento bsico de carbohidratos para la diabetes mellitus (Basic Carbohydrate Counting for Diabetes Mellitus) El recuento de carbohidratos es un mtodo destinado a calcular la cantidad de carbohidratos en la dieta. El consumo de carbohidratos aumenta naturalmente el nivel de azcar (glucosa) en la sangre, por lo que es importante que sepa la cantidad que debe incluir en cada comida. El recuento de carbohidratos ayuda a mantener el nivel de glucosa en la sangre dentro de los lmites normales. La cantidad permitida de carbohidratos es diferente para cada persona. Un nutricionista puede ayudarlo a calcular la cantidad adecuada para usted. Una vez que sepa la cantidad de carbohidratos que puede consumir, podr calcular los carbohidratos de los alimentos que desea comer. Los siguientes alimentos incluyen carbohidratos:  Granos, como panes y cereales.  Frijoles secos y productos con soja.  Vegetales almidonados, como papas, guisantes y maz.  Frutas y jugos de frutas.  Leche y yogur.  Dulces y bocadillos, como pastel, galletas, caramelos, papas fritas de bolsa, refrescos y bebidas frutales con azcar. RECUENTO DE CARBOHIDRATOS Hay dos maneras de calcular los carbohidratos de los alimentos. Puede usar cualquiera de los dos mtodos o una combinacin de ambos. Leer la etiqueta de informacin nutricional de los alimentos envasados La informacin nutricional es una etiqueta incluida en casi todas las bebidas y los alimentos envasados de los [place]. Indica el tamao de la porcin de ese alimento o bebida e informacin sobre los nutrientes de cada porcin, incluso los gramos (g) de carbohidratos por porcin.  Decida la cantidad de porciones que comer o tomar de este alimento o bebida. Multiplique la cantidad de porciones por el nmero de gramos de carbohidratos indicados en la etiqueta para esa porcin. El total ser la cantidad de carbohidratos que consumir al comer ese alimento o tomar esa  bebida. Conocer las porciones estndar de los alimentos Cuando coma alimentos no envasados o que no incluyan la informacin nutricional en la etiqueta, deber medir las porciones para poder calcular la cantidad de carbohidratos. Una porcin de la mayora de los alimentos ricos en carbohidratos contiene alrededor de 15g de carbohidratos. La siguiente lista incluye los tamaos de porcin de los alimentos ricos en carbohidratos que contienen alrededor de 15g de carbohidratos por porcin:   1rebanada de pan (1oz) o 1tortilla de seis pulgadas.  panecillo de hamburguesa o bollito tipo ingls.  4a 6galletas.   de taza de cereal sin azcar y seco.   taza de cereal caliente.   de taza de arroz o pastas.  taza de pur de papas o de una papa grande al horno.  1taza de frutas frescas o una fruta pequea.  taza de frutas o jugo de frutas enlatados o congelados.  1 taza de leche.   de taza de yogur descremado sin ningn agregado o de yogur endulzado con edulcorante artificial.  taza de vegetales almidonados, como guisantes, maz o papas, o de frijoles secos cocidos. Decida la cantidad de porciones estndar que comer. Multiplique la cantidad de porciones por 15 (los gramos de carbohidratos en esa porcin). Por ejemplo, si come 2tazas de fresas, habr comido 2porciones y 30g de carbohidratos (2porciones x 15g = 30g). Para las comidas como sopas y guisos, en las que se mezcla ms de un alimento, deber contar los carbohidratos de cada alimento incluido. EJEMPLO DE RECUENTO DE CARBOHIDRATOS Ejemplo de cena  3 onzas de pechugas de pollo.   de taza de arroz integral.   taza de maz.  1 taza de leche.  1 taza de fresas   con crema batida sin azcar. Clculo de carbohidratos [place] 1: Identifique los alimentos que contienen carbohidratos:   Arroz.  Maz.  Leche.  Fresas. [place] 2: Calcule el nmero de porciones que consumir de cada uno:   2 porciones de  arroz.  1 porcin de maz.  1 porcin de leche.  1 porcin de fresas. [place] 3: Multiplique cada una de esas porciones por 15g:   2 porciones de arroz x 15 g = 30 g.  1 porcin de maz x 15 g = 15 g.  1 porcin de leche x 15 g = 15 g.  1 porcin de fresas x 15 g = 15 g. [place] 4: Sume todas las cantidades para conocer el total de gramos de carbohidratos consumidos: 30 g + 15 g + 15 g + 15 g = 75 g. Document Released: 07/07/2011 Document Revised: 08/29/2013 [employer] Patient Information 2015 [employer]. This information is not intended to replace advice given to you by your health care provider. Make sure you discuss any questions you have with your health care provider.  

## 2014-12-08 NOTE — Progress Notes (Signed)
Patient ID: Brooke Dorsey, female   DOB: 1960-11-30, 54 y.o.   MRN: [id number]  [id number]  [id number]  DOB - 07/20/1960  CC:  Chief Complaint  Patient presents with  . Hospitalization Follow-up       HPI: Brooke Dorsey is a 54 y.o. female here today to establish medical care. Patient was recently in the hospital on 8/1-8/7 for peritonsillar cellulitis and DM. She was that that time intubated and given IV antibiotics and steroids. She has been discharged home on Augmentin which she is scheduled to take until 8/16. She reports taking medication without difficulty. She reports improvement in symptoms and has been unable to schedule appointment with ENT for follow up as directed at discharge.  Patient presents today with a blood sugar of 305 only after eating a egg and tortilla. She currently is prescribed to take Humalog 75/25 10 units once per day and Sliding scale with meal coverage.  Patient reports that she has only been taking Novolog 10 units once per day. No Known Allergies Past Medical History  Diagnosis Date  . Diabetes mellitus without complication    Current Outpatient Prescriptions on File Prior to Visit  Medication Sig Dispense Refill  . amoxicillin-clavulanate (AUGMENTIN) 875-125 MG per tablet Take 1 tablet by mouth 2 (two) times daily. Para su infeccion 19 tablet 0  . insulin aspart (NOVOLOG) 100 UNIT/ML injection Inject 0-20 Units into the skin every 4 (four) hours. 10 mL 11  . insulin lispro protamine-lispro (HUMALOG 75/25 MIX) (75-25) 100 UNIT/ML SUSP injection Inject 10 Units into the skin every morning.     . predniSONE (DELTASONE) 50 MG tablet Toma una pastilla el lunes 8/8 para su infeccion (Patient not taking: Reported on 12/08/2014) 1 tablet 0   No current facility-administered medications on file prior to visit.   History reviewed. No pertinent family history. Social History   Social History  . Marital Status: Married    Spouse Name: N/A  . Number  of Children: N/A  . Years of Education: N/A   Occupational History  . Not on file.   Social History Main Topics  . Smoking status: Never Smoker   . Smokeless tobacco: Not on file  . Alcohol Use: No  . Drug Use: No  . Sexual Activity: Not on file   Other Topics Concern  . Not on file   Social History Narrative    Review of Systems: Constitutional: Negative for fever, chills, diaphoresis, activity change, appetite change and fatigue. HENT: Negative for ear pain, nosebleeds, congestion, facial swelling, rhinorrhea, neck pain, neck stiffness and ear discharge.  Eyes: Negative for pain, discharge, redness, itching and visual disturbance. Respiratory: Negative for cough, choking, chest tightness, shortness of breath, wheezing and stridor.  Cardiovascular: Negative for chest pain, palpitations and leg swelling. Gastrointestinal: Negative for abdominal distention. Genitourinary: Negative for dysuria, urgency, frequency, hematuria, flank pain, decreased urine volume, difficulty urinating and dyspareunia.  Musculoskeletal: Negative for back pain, joint swelling, arthralgia and gait problem. Neurological: Negative for dizziness, tremors, seizures, syncope, facial asymmetry, speech difficulty, weakness, light-headedness, numbness and headaches.  Hematological: Negative for adenopathy. Does not bruise/bleed easily. Psychiatric/Behavioral: Negative for hallucinations, behavioral problems, confusion, dysphoric mood, decreased concentration and agitation.    Objective:   Filed Vitals:   12/08/14 0948  BP: 102/67  Pulse: 76  Temp: 98.4 F (36.9 C)  Resp: 18    Physical Exam  Constitutional: She is oriented to person, place, and time.  HENT:  Mouth/Throat: Oropharynx is clear and moist.  Cardiovascular: Normal rate, regular rhythm and normal heart sounds.   Pulses:      Dorsalis pedis pulses are 2+ on the right side, and 2+ on the left side.  Pulmonary/Chest: Effort normal and breath  sounds normal.  Musculoskeletal: Normal range of motion. She exhibits no edema.  Feet:  Right Foot:  Protective Sensation: 10 sites tested.10 sites sensed. Skin Integrity: Negative for skin breakdown.  Left Foot:  Protective Sensation: 10 sites tested. 10 sites sensed. Skin Integrity: Negative for skin breakdown.  Neurological: She is alert and oriented to person, place, and time.  Psychiatric: She has a normal mood and affect.     Lab Results  Component Value Date   WBC 10.6* 12/02/2014   HGB 12.0 12/02/2014   HCT 35.7* 12/02/2014   MCV 83.8 12/02/2014   PLT 322 12/02/2014   Lab Results  Component Value Date   CREATININE 0.65 12/03/2014   BUN 17 12/03/2014   NA 137 12/03/2014   K 3.3* 12/03/2014   CL 98* 12/03/2014   CO2 30 12/03/2014    Lab Results  Component Value Date   HGBA1C 12.1* 11/27/2014   Lipid Panel     Component Value Date/Time   TRIG 95 12/01/2014 0203       Assessment and plan:   Ammi was seen today for hospitalization follow-up.  Diagnoses and all orders for this visit:  Diabetes mellitus type 2, uncontrolled -     Glucose (CBG) -     Microalbumin, urine -     insulin aspart (novoLOG) injection 20 Units; Inject 0.2 mLs (20 Units total) into the skin once. -    Begin insulin glargine (LANTUS) 100 UNIT/ML injection; Inject 0.1 mLs (10 Units total) into the skin at bedtime. -    Bggin insulin aspart (NOVOLOG) 100 UNIT/ML injection; Inject 0-11 Units into the skin 3 (three) times daily before meals. -     Insulin Syringe-Needle U-100 (BD INSULIN SYRINGE ULTRAFINE) 31G X 15/64" 0.5 ML MISC; Use as directed -     Glucose (CBG) I spent a total of 25 minutes educating patient and her son with interpreter on difference in insulin, need for longer acting insulin, long term complications of uncontrolled diabetes. I will begin her on a once daily basal insulin Lantus to simply regimen. I have written out a new sliding scale for patient as well written in  [place] and [place]. RN educated patient on how to use insulin and check sugars. She has been given a simplified log for recording to bring to every visit.   Novolog sliding scale of  150-200, give 3 units 201-250, give 5 units  251-300, give 7 units 301-350, give 9 units 350-400, give 11 units  Need for Tdap vaccination -     Tdap vaccine greater than or equal to 7yo IM  Peritonsillar cellulitis Resolved. She is still taking antibiotics. I will have social work to contact hospital and see if someone is working on getting her scheduled for ENT. Discharge papers do not state who she should f/u with but says they will contact her with a appointment. If no appointment is being made I will have her apply for hospital discount and make the referral myself.    Due to language barrier, an interpreter was present during the history-taking and subsequent discussion (and for part of the physical exam) with this patient.  Return in about 2 weeks (around 12/22/2014) for lab visit/Nurse Visit-log review, 3 mo PCP DM.   Total  time spent with patient was 45 minutes. > 50% spent counseling and coordination care with patient.   The patient was given clear instructions to go to ER or return to medical center if symptoms don't improve, worsen or new problems develop. The patient verbalized understanding. The patient was told to call to get lab results if they haven't heard anything in the next week.     Ambrose Finland, NP-C [facility] [phone] 12/08/2014, 9:54 AM

## 2014-12-08 NOTE — Telephone Encounter (Signed)
Received communication from Holland Commons, NP that patient was recently admitted with peritonsillar abscess and was told she would be scheduled a follow-up appointment with ENT. Patient had not yet heard when ENT appointment would be.  After chart review, patient was to follow-up with [facility] ENT after discharge.  Call placed to [facility] [phone]) and appointment scheduled for 12/22/14 at 1430 with Dr. Annalee Genta.  Patient uninsured so she must pay $200 upfront and then she would be put on a payment plan.  Call placed to patient using [employer] (Interpretor # [phone]). Spoke with patient who did not understand what this RN was saying. She gave verbal permission for this RN to speak with her son.  Informed patient's son of scheduled appointment time and of cost of appointment.  Patient's son verbalized understanding and indicates patient will keep appointment.  Provided patient's son with [facility] address and phone number. No other needs identified.  Holland Commons, NP at [facility] updated.

## 2014-12-09 LAB — MICROALBUMIN, URINE: Microalb, Ur: 0.7 mg/dL (ref <2.0)

## 2014-12-27 ENCOUNTER — Ambulatory Visit: Payer: Self-pay

## 2014-12-27 ENCOUNTER — Other Ambulatory Visit: Payer: Self-pay | Admitting: Internal Medicine

## 2014-12-27 ENCOUNTER — Ambulatory Visit: Payer: Self-pay | Attending: Internal Medicine | Admitting: *Deleted

## 2014-12-27 VITALS — BP 126/79 | HR 66 | Temp 97.8°F | Resp 16 | Wt 130.2 lb

## 2014-12-27 DIAGNOSIS — E1165 Type 2 diabetes mellitus with hyperglycemia: Secondary | ICD-10-CM | POA: Insufficient documentation

## 2014-12-27 DIAGNOSIS — IMO0002 Reserved for concepts with insufficient information to code with codable children: Secondary | ICD-10-CM

## 2014-12-27 DIAGNOSIS — J36 Peritonsillar abscess: Secondary | ICD-10-CM

## 2014-12-27 DIAGNOSIS — Z794 Long term (current) use of insulin: Secondary | ICD-10-CM | POA: Insufficient documentation

## 2014-12-27 LAB — COMPLETE METABOLIC PANEL WITH GFR
ALT: 20 U/L (ref 6–29)
AST: 24 U/L (ref 10–35)
Albumin: 3.6 g/dL (ref 3.6–5.1)
Alkaline Phosphatase: 104 U/L (ref 33–130)
BUN: 10 mg/dL (ref 7–25)
CALCIUM: 9.4 mg/dL (ref 8.6–10.4)
CHLORIDE: 105 mmol/L (ref 98–110)
CO2: 27 mmol/L (ref 20–31)
CREATININE: 0.55 mg/dL (ref 0.50–1.05)
GFR, Est African American: >89 mL/min (ref >=60)
GFR, Est Non African American: >89 mL/min (ref >=60)
Glucose, Bld: 142 mg/dL — ABNORMAL HIGH (ref 65–99)
Potassium: 5 mmol/L (ref 3.5–5.3)
Sodium: 141 mmol/L (ref 135–146)
Total Bilirubin: 0.6 mg/dL (ref 0.2–1.2)
Total Protein: 6.5 g/dL (ref 6.1–8.1)

## 2014-12-27 LAB — LIPID PANEL
CHOLESTEROL: 200 mg/dL (ref 125–200)
HDL: 66 mg/dL (ref >=46)
LDL Cholesterol: 120 mg/dL (ref <130)
Total CHOL/HDL Ratio: 3 Ratio (ref <=5.0)
Triglycerides: 72 mg/dL (ref <150)
VLDL: 14 mg/dL (ref <30)

## 2014-12-27 LAB — CBC WITH DIFFERENTIAL/PLATELET
BASOS PCT: 1 % (ref 0–1)
Basophils Absolute: 0 10*3/uL (ref 0.0–0.1)
EOS ABS: 0.2 10*3/uL (ref 0.0–0.7)
EOS PCT: 5 % (ref 0–5)
HCT: 37.9 % (ref 36.0–46.0)
HEMOGLOBIN: 12.4 g/dL (ref 12.0–15.0)
Lymphocytes Relative: 50 % — ABNORMAL HIGH (ref 12–46)
Lymphs Abs: 2.3 10*3/uL (ref 0.7–4.0)
MCH: 27.6 pg (ref 26.0–34.0)
MCHC: 32.7 g/dL (ref 30.0–36.0)
MCV: 84.4 fL (ref 78.0–100.0)
MONO ABS: 0.4 10*3/uL (ref 0.1–1.0)
MONOS PCT: 8 % (ref 3–12)
MPV: 10.7 fL (ref 8.6–12.4)
Neutro Abs: 1.7 10*3/uL (ref 1.7–7.7)
Neutrophils Relative %: 36 % — ABNORMAL LOW (ref 43–77)
PLATELETS: 309 10*3/uL (ref 150–400)
RBC: 4.49 MIL/uL (ref 3.87–5.11)
RDW: 14.1 % (ref 11.5–15.5)
WBC: 4.6 10*3/uL (ref 4.0–10.5)

## 2014-12-27 LAB — POCT CBG (FASTING - GLUCOSE)-MANUAL ENTRY: GLUCOSE FASTING, POC: 136 mg/dL — AB (ref 70–99)

## 2014-12-27 NOTE — Progress Notes (Signed)
Spoke with patient via [employer], [place], [place] [id number] Patient presents with son for fasting lab, BP check, CBG and record review for T2DM Med list reviewed; patient reports taking both meds as directed Taking sliding scale novolog insulin and lantus 10 units q HS Patient's AM fasting blood sugars ranging 100-130 (3 outliers 180, 200, 200) Patient's before lunch blood sugars ranging 90-150 Patient's before dinner blood sugars ranging 110-130 Denies increased thirst and urination Positive for blurred vision following left eye surgery in June 2016 Discussed missed ENT visit. Son states he was unaware of this appt. Phone # for ENT given to son to r/s appt   CBG 160 AM fasting per patient  CBG in-house 136 (fasting)   Lab Results  Component Value Date   HGBA1C 12.1* 11/27/2014   Filed Vitals:   12/27/14 1015  BP: 126/79  Pulse: 66  Temp: 97.8 F (36.6 C)  Resp: 16     Per PCP: No changes to meds at this time  Patient given new blood sugar log and she and son instructed on use. Instructed to bring to all future visits.  Patient/son advised to call for med refills at least 7 days before running out so as not to go without.  Patient aware that she is to f/u with PCP 3 months from last visit. Due 03/10/2015

## 2014-12-28 LAB — VITAMIN D 25 HYDROXY (VIT D DEFICIENCY, FRACTURES): VIT D 25 HYDROXY: 26 ng/mL — AB (ref 30–100)

## 2015-01-01 ENCOUNTER — Other Ambulatory Visit: Payer: Self-pay | Admitting: Internal Medicine

## 2015-01-01 DIAGNOSIS — E785 Hyperlipidemia, unspecified: Secondary | ICD-10-CM

## 2015-01-01 MED ORDER — ATORVASTATIN CALCIUM 20 MG PO TABS: 20.0000 mg | ORAL_TABLET | Freq: Every day | ORAL | Status: AC

## 2015-01-02 ENCOUNTER — Telehealth: Payer: Self-pay

## 2015-01-02 NOTE — Telephone Encounter (Signed)
Interpreter line used [place] [phone] Patient not available Son unable to verify date of birth  Message left with son to have his mom return our call

## 2015-01-02 NOTE — Telephone Encounter (Signed)
-----   Message from Ambrose Finland, NP sent at 01/01/2015 10:30 AM EDT ----- Will begin patient on atorvastatin 20 mg once every evening for elevated cholesterol. Explain to patient that being diabetic and having elevated cholesterol places her at higher risk for stroke heart disease.  Vitamin D is slightly low please have patient get over-the-counter vitamin D 800 international units to take daily. This is essential for her bone health

## 2017-06-30 IMAGING — CT CT NECK W/ CM
3 of 4 series · 14 of 33 positions shown, 17 images · IV contrast (APPLIED)
Comparison: None.

CLINICAL DATA: Left neck swelling and pain and dysphagia for past 3
days.

EXAM:
CT NECK WITH CONTRAST
TECHNIQUE: Multidetector CT imaging of the neck was performed using the
standard protocol following the bolus administration of intravenous
contrast.
CONTRAST:  75mL OMNIPAQUE IOHEXOL 300 MG/ML  SOLN

[Series 5: neck 2.0 i31s 3 · axial · 0.49mm/px · z∈[-168,-32]mm · 6 of 96 slices shown, 8 images]
[im 14/96  soft-tissue]
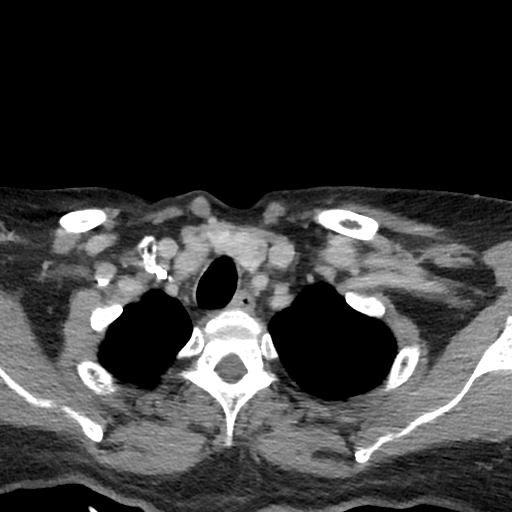
[im 14/96  bone]
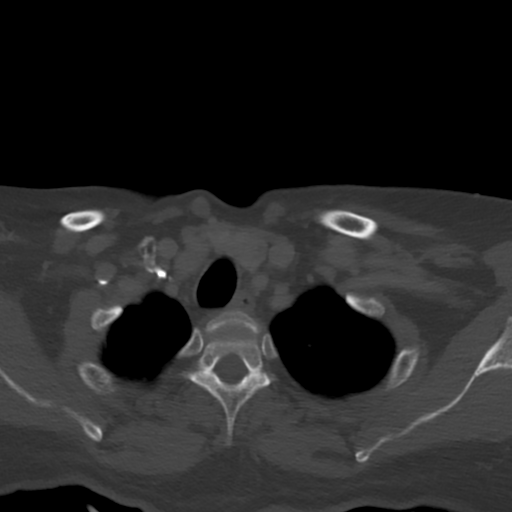
[im 28/96  bone]
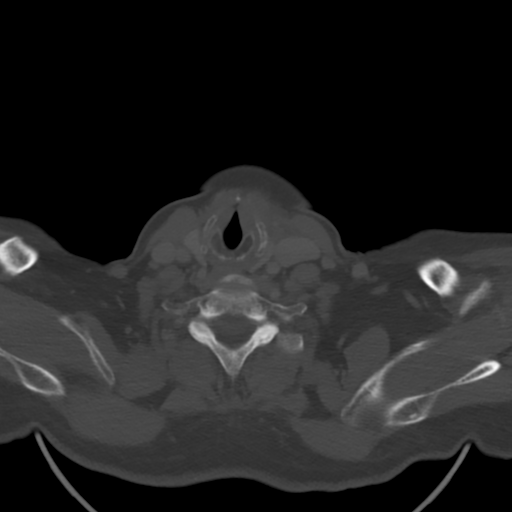
[im 41/96  bone]
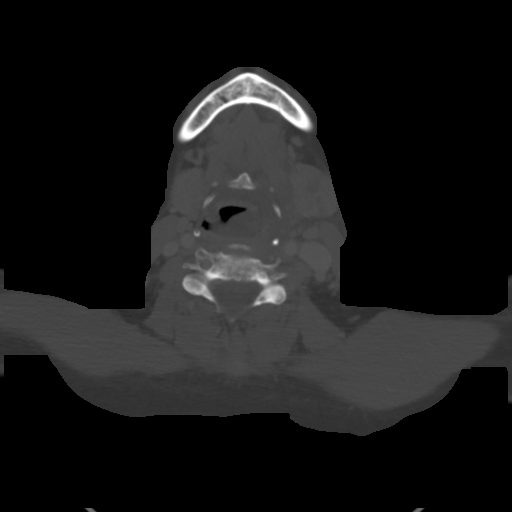
[im 55/96  bone]
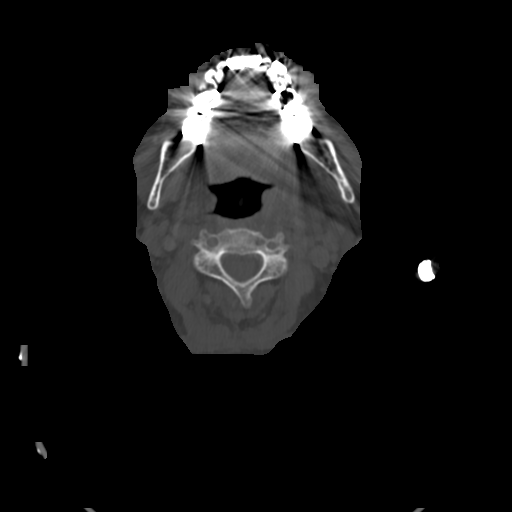
[im 68/96  soft-tissue]
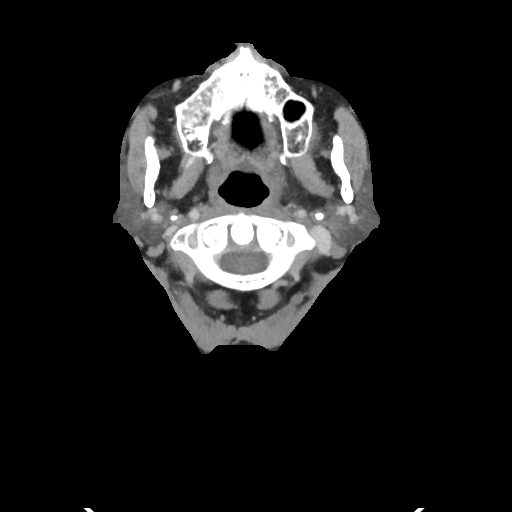
[im 68/96  bone]
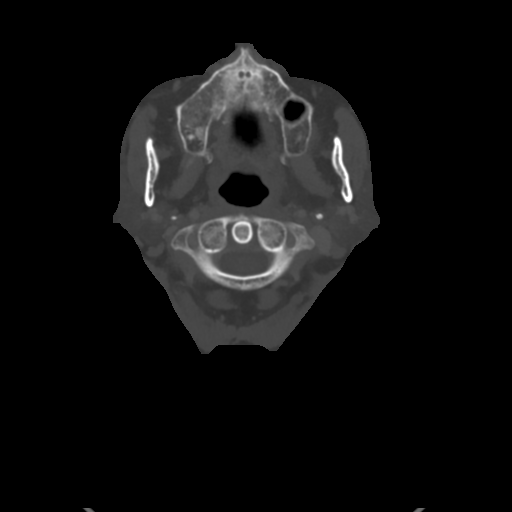
[im 82/96  bone]
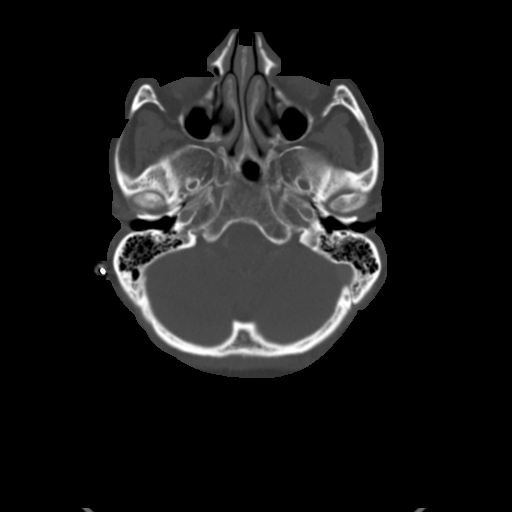

[Series 7: coronal st · coronal · 0.36mm/px · 3 of 55 slices shown]
[im 11/55  bone]
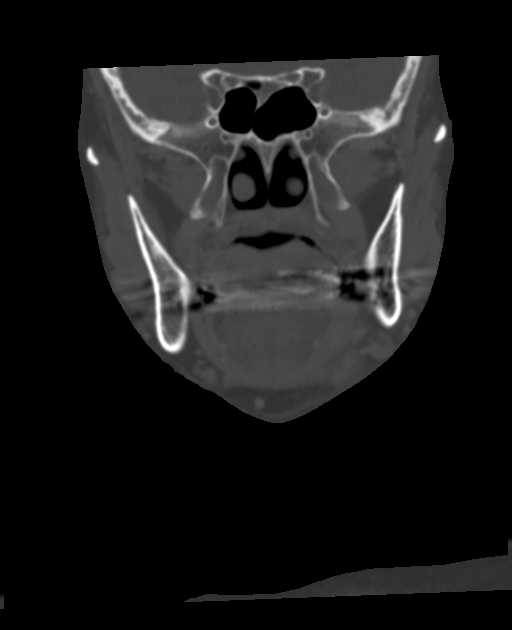
[im 22/55  bone]
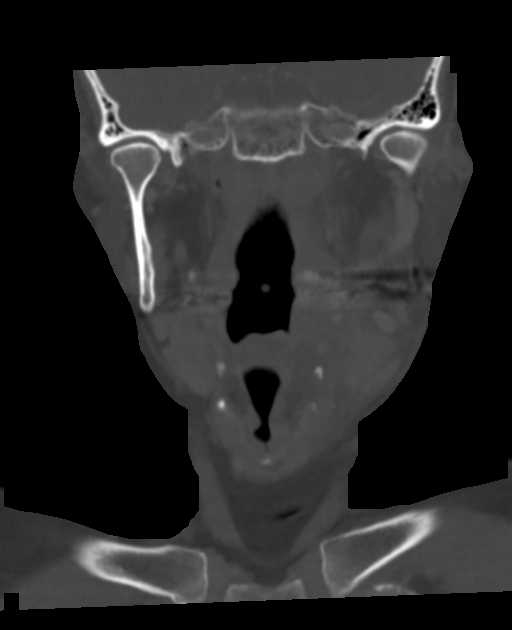
[im 33/55  bone]
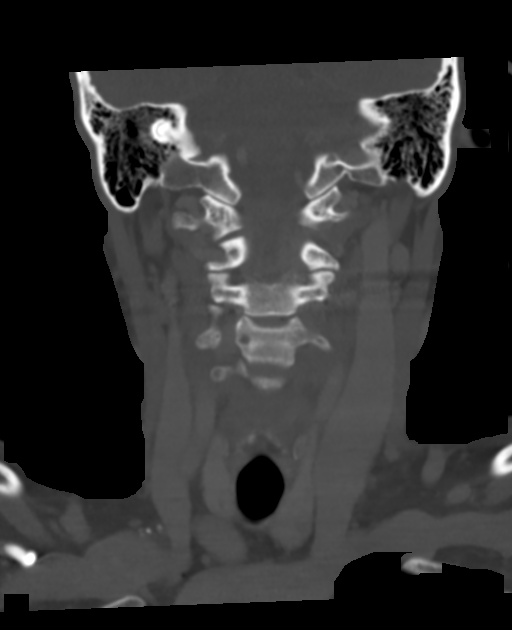

[Series 8: sagittal st · sagittal · 0.37mm/px · 5 of 62 slices shown, 6 images]
[im 21/62  bone]
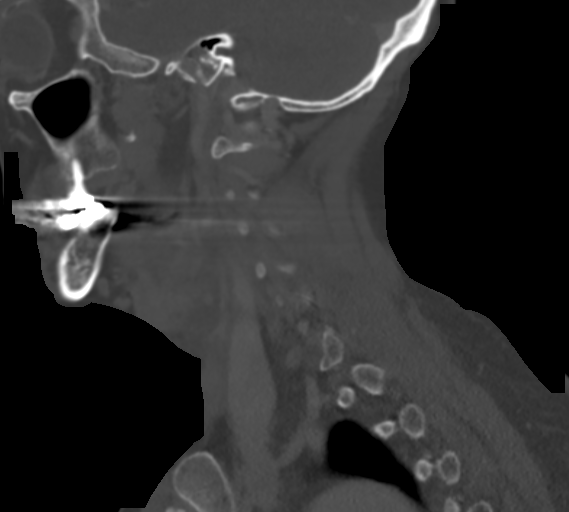
[im 26/62  bone]
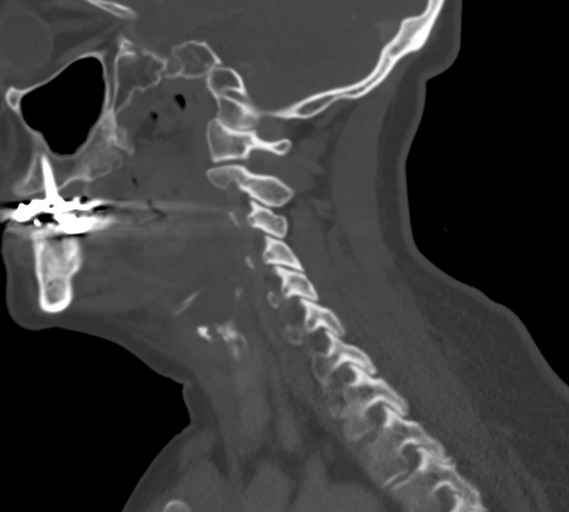
[im 31/62  soft-tissue]
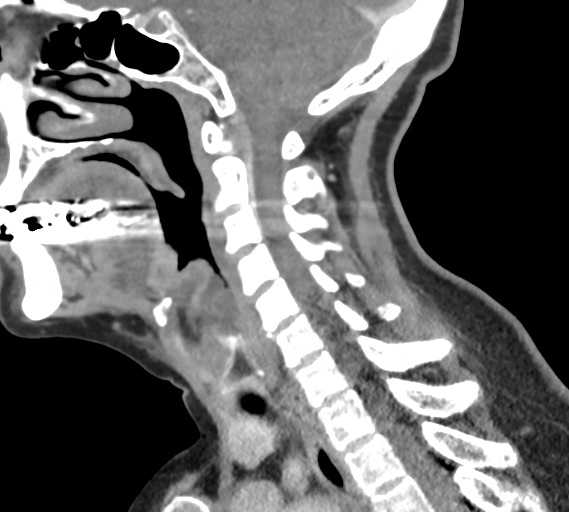
[im 31/62  bone]
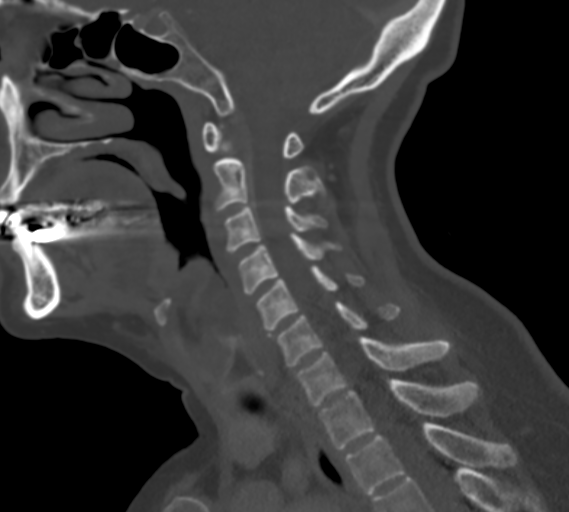
[im 36/62  bone]
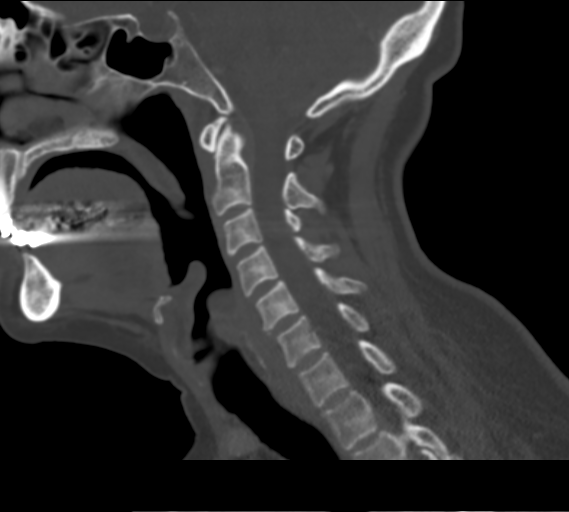
[im 41/62  bone]
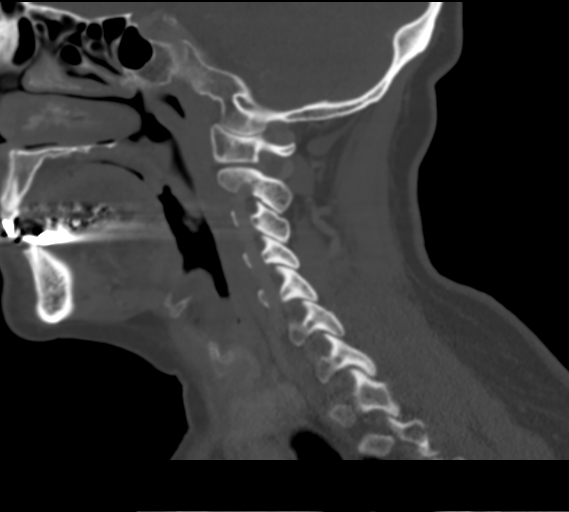

[14 of 33 positions shown; findings below may reference images not displayed]

FINDINGS: Asymmetric swelling and decreased attenuation is seen involving the
left palatine tonsil, epiglottis, and retropharyngeal soft tissues.
No focal fluid collection seen to suggest abscess formation.

Mild left level 2 jugular lymphadenopathy is seen with largest lymph
node measuring up to 12 mm. No evidence of central necrosis. This is
likely reactive in etiology. No other masses or lymphadenopathy
identified within the neck.
IMPRESSION: Asymmetric edema involving the left palatine tonsil, epiglottis, and
retropharyngeal soft tissues. No discrete abscess identified.

Mild left level 2 jugular lymphadenopathy, likely reactive in
etiology.

## 2017-07-02 IMAGING — CT CT NECK W/ CM
4 of 5 series · 15 of 33 positions shown, 17 images · IV contrast (Omni 300)
Comparison: 11/26/2014.

CLINICAL DATA: Neck pain. Trouble swallowing. Patient feels like
her throat is closing up.

EXAM:
CT NECK WITH CONTRAST
TECHNIQUE: Multidetector CT imaging of the neck was performed using the
standard protocol following the bolus administration of intravenous
contrast.
CONTRAST:  75mL OMNIPAQUE IOHEXOL 300 MG/ML  SOLN

[Series 2: neck 2.0 i31s 3 · axial · 0.48mm/px · z∈[+1109,+1231]mm · 4 of 103 slices shown, 5 images]
[im 21/103  soft-tissue]
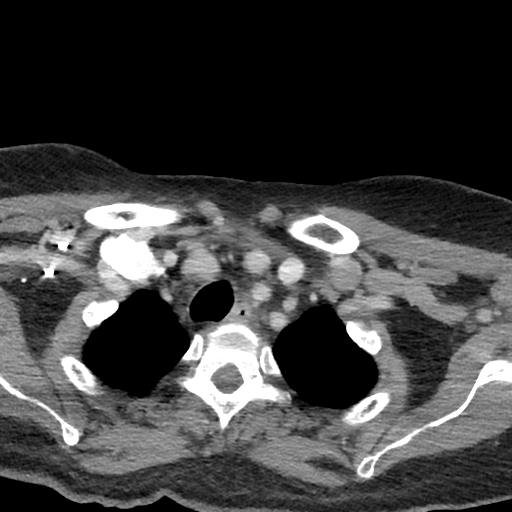
[im 21/103  bone]
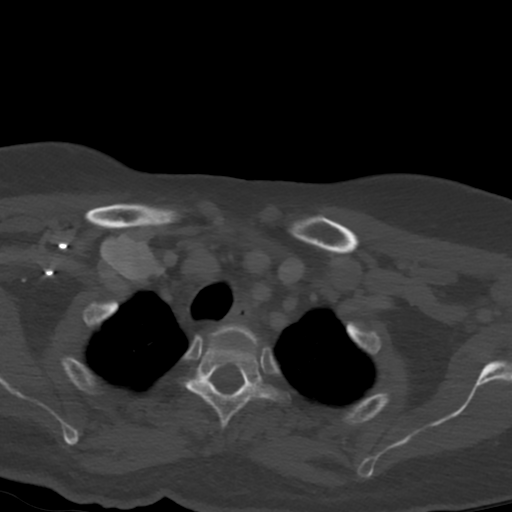
[im 41/103  bone]
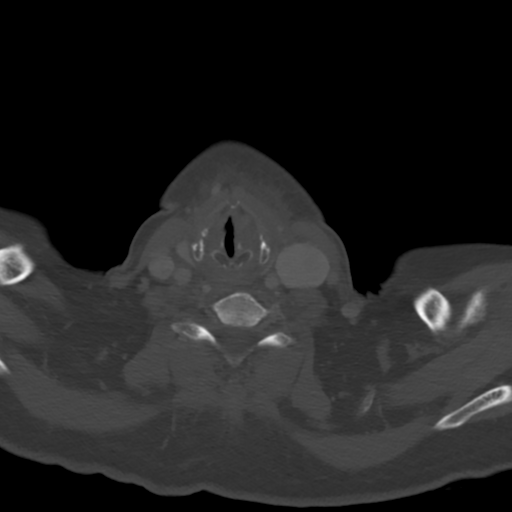
[im 62/103  bone]
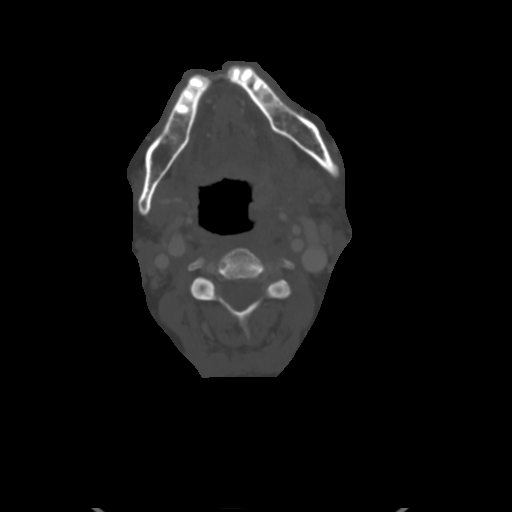
[im 82/103  bone]
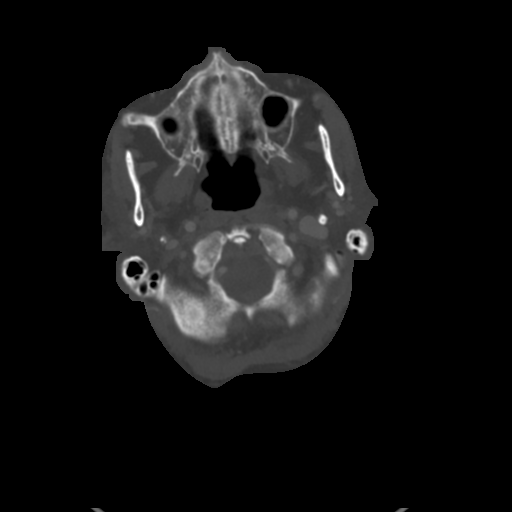

[Series 5: coronal st · coronal · 0.39mm/px · 3 of 99 slices shown]
[im 20/99  bone]
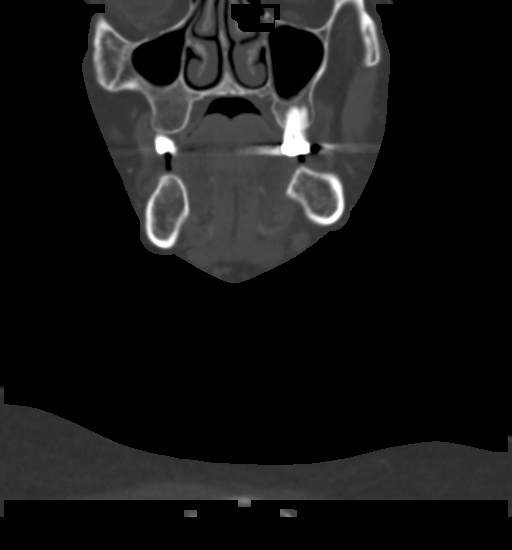
[im 40/99  bone]
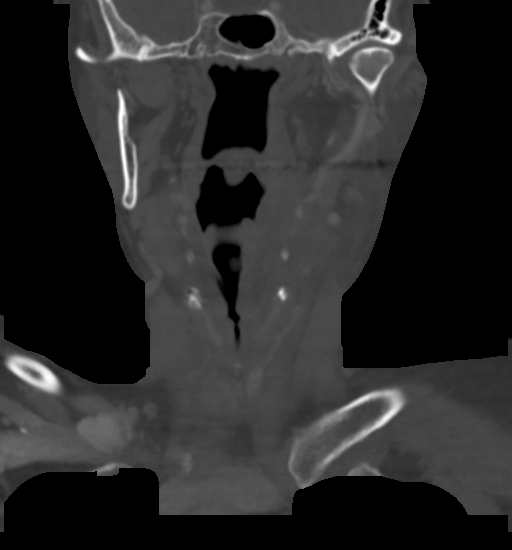
[im 59/99  bone]
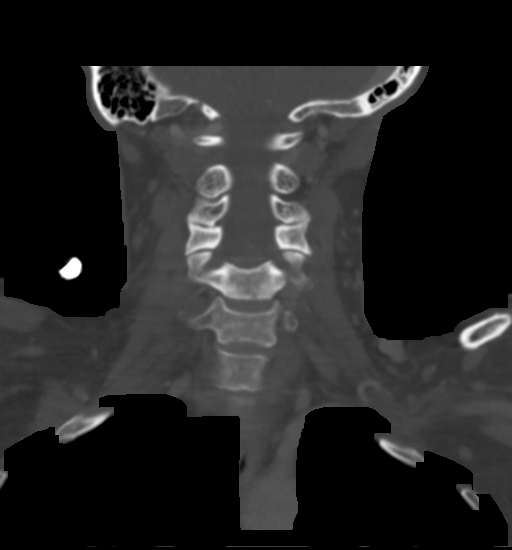

[Series 6: sagittal st · sagittal · 0.42mm/px · 5 of 101 slices shown, 6 images]
[im 34/101  bone]
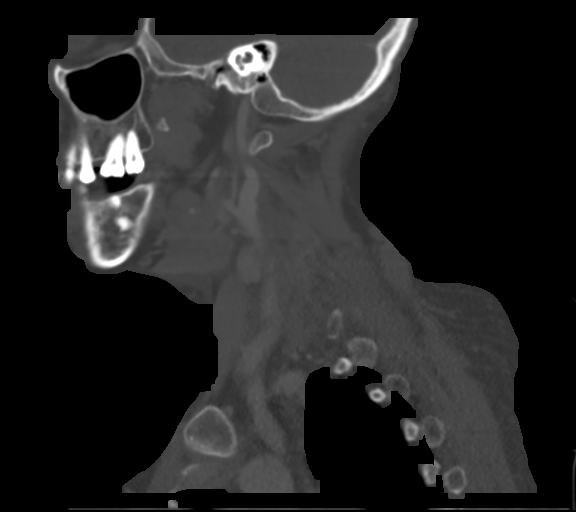
[im 42/101  bone]
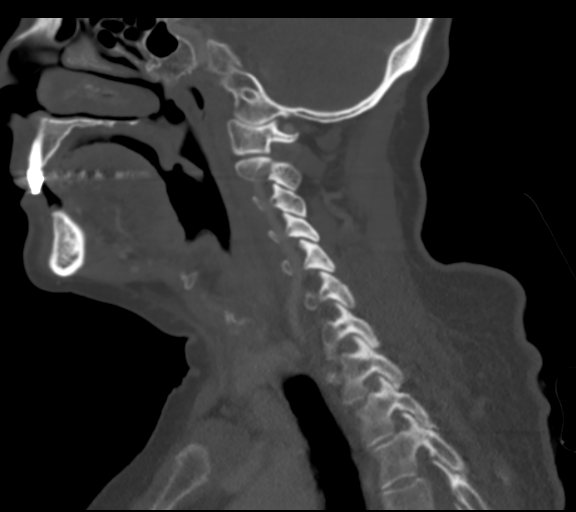
[im 51/101  soft-tissue]
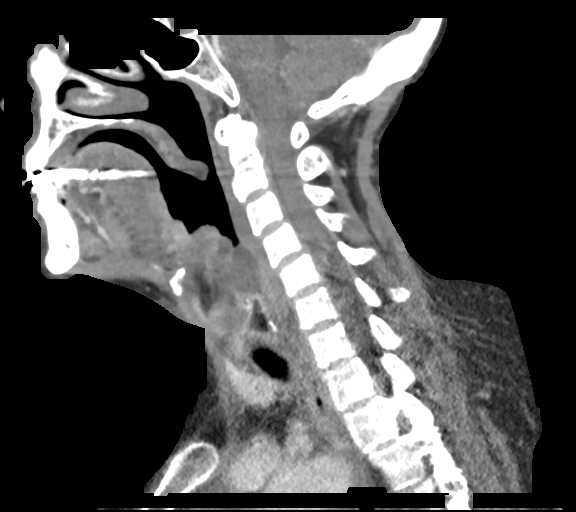
[im 51/101  bone]
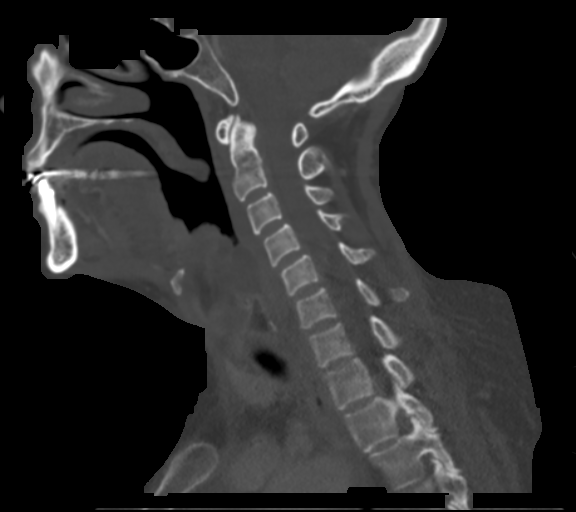
[im 59/101  bone]
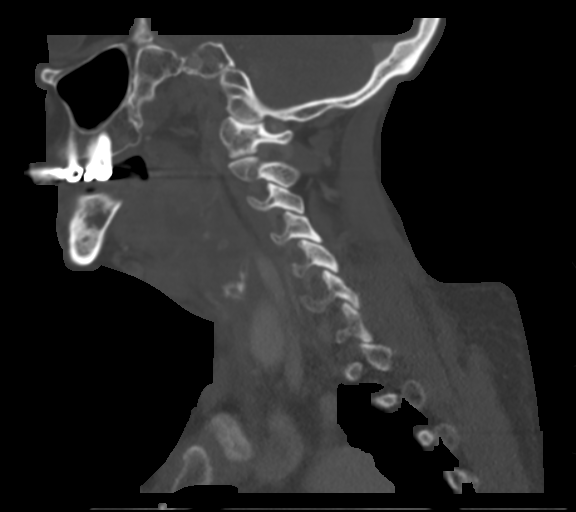
[im 67/101  bone]
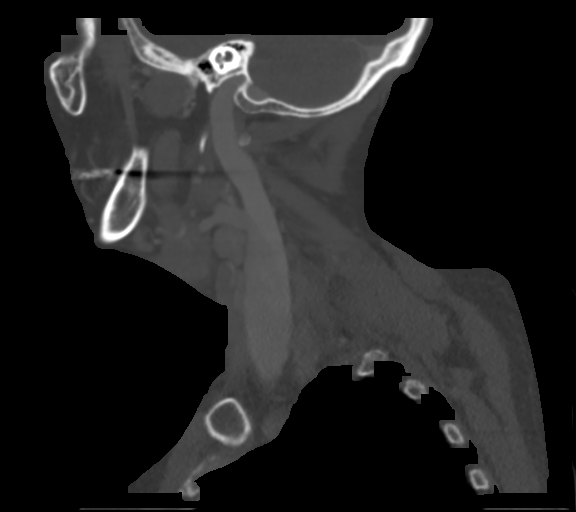

[Series 7: orthogonal st · axial · 0.39mm/px · z∈[+1089,+1171]mm · 3 of 105 slices shown]
[im 21/105  bone]
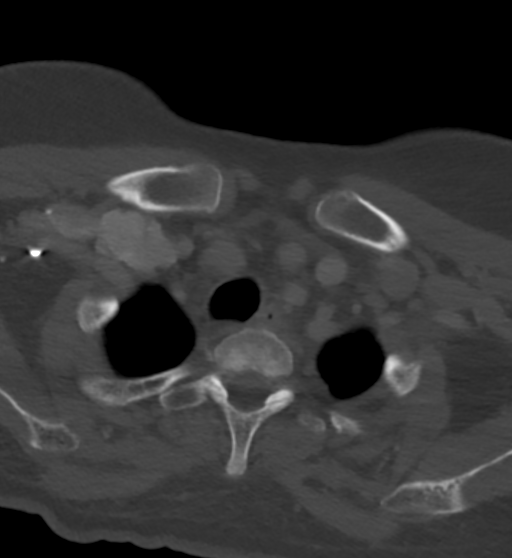
[im 42/105  bone]
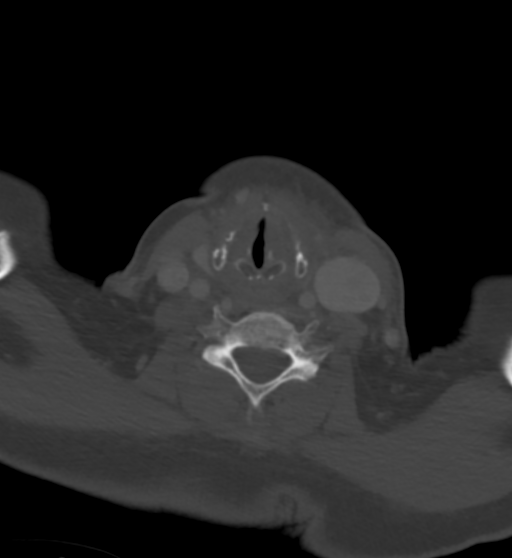
[im 63/105  bone]
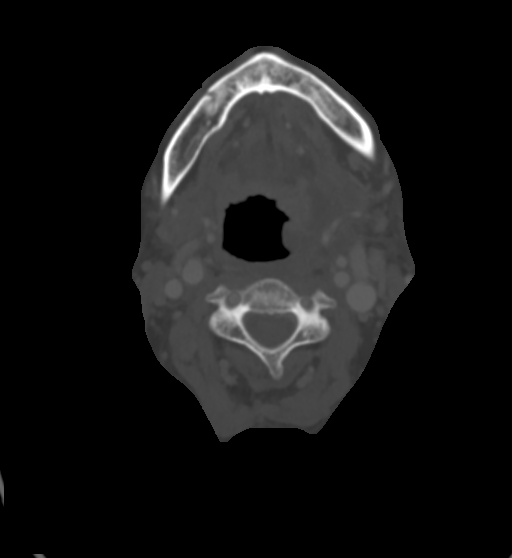

[15 of 33 positions shown; findings below may reference images not displayed]

FINDINGS: Findings are similar to the recent prior CT. There is heterogeneous,
predominantly low attenuation edema/cellulitis that extends from the
soft palate superiorly, surrounds the palatine tonsils, left greater
than right, and contacts the submandibular glands, greater on the
left. The inflammatory changes extend to the false cords and area
for got folds and chest to the superior margin of the true vocal
cords. There is a more defined retropharyngeal fluid collection,
which measures 2 cm x 1.9 cm x 2.1 cm, which has mildly increased in
size from the prior study and is better defined. It previously
measured approximately 16 mm x 12 mm x 16 mm. The oral pharyngeal
airway is narrowed anterior to this collection, similar to the prior
exam. The upper laryngeal airway is also narrowed, similar to the
prior study. Hazy inflammatory changes seen in the subcutaneous fat
of the submental region.

Superior to the level of the soft palate, the parapharyngeal spaces
are unremarkable. The masticator spaces are also unremarkable.

Salivary glands: There is heterogeneous attenuation of the left
submandibular gland with more homogeneous attenuation of the right
submandibular gland. Left submandibular gland is mildly larger than
the right. This may be reactive to the adjacent inflammation. It
could potentially be origin, felt less likely. The parotid glands
are unremarkable.

Thyroid: Small ill-defined hypo attenuating lesions are noted
consistent with sub cm nodules, stable.

Lymph nodes: Mildly enlarged left sided lymph nodes are noted. There
is an 11 mm short axis level 2 jugulodigastric node. A slightly
larger node is seen just below this on the left measuring just over
12 mm in short axis. These are stable. No new adenopathy.

Vascular: Unremarkable.

Limited intracranial: Unremarkable.

Visualized orbits: Unremarkable.

Mastoids and visualized paranasal sinuses: Clear.

Skeleton: No significant abnormality. No bone resorption is seen to
suggest osteomyelitis.

Upper chest: No upper mediastinal mass or enlarged lymph node.
Visualized upper lungs are clear.
IMPRESSION: 1. Slight worsening when compared to the prior CT.
2. There are persistent, extensive inflammatory changes surrounding
and involving the oral pharynx and larynx, left greater than right.
Inflammatory changes are contiguous with the left submandibular
gland, which is also heterogeneous in attenuation and slightly
larger than the right submandibular gland.
3. There is a retropharyngeal collection consistent with an abscess,
which is better defined and larger than it was previously. Currently
it measures 2 x 1.9 x 2.1 cm. No other discrete collection.
4. Oral pharyngeal and upper laryngeal airway is narrowed similar to
the prior study.
